# Patient Record
Sex: Female | Born: 1960
Health system: Southern US, Community
[De-identification: ages and names within clinical notes are randomized; demographics above are authoritative.]

## PROBLEM LIST (undated history)

## (undated) DIAGNOSIS — E785 Hyperlipidemia, unspecified: Secondary | ICD-10-CM

## (undated) DIAGNOSIS — B019 Varicella without complication: Secondary | ICD-10-CM

## (undated) DIAGNOSIS — T7840XA Allergy, unspecified, initial encounter: Secondary | ICD-10-CM

## (undated) DIAGNOSIS — N200 Calculus of kidney: Secondary | ICD-10-CM

## (undated) DIAGNOSIS — I10 Essential (primary) hypertension: Secondary | ICD-10-CM

## (undated) DIAGNOSIS — E119 Type 2 diabetes mellitus without complications: Secondary | ICD-10-CM

## (undated) HISTORY — DX: Hyperlipidemia, unspecified: E78.5

## (undated) HISTORY — DX: Type 2 diabetes mellitus without complications: E11.9

## (undated) HISTORY — DX: Varicella without complication: B01.9

## (undated) HISTORY — PX: CATARACT EXTRACTION, BILATERAL: SHX1313

## (undated) HISTORY — DX: Calculus of kidney: N20.0

## (undated) HISTORY — PX: RETINAL DETACHMENT SURGERY: SHX105

## (undated) HISTORY — DX: Allergy, unspecified, initial encounter: T78.40XA

## (undated) HISTORY — DX: Essential (primary) hypertension: I10

---

## 1984-10-03 HISTORY — PX: WRIST SURGERY: SHX841

## 2013-10-29 ENCOUNTER — Ambulatory Visit (INDEPENDENT_AMBULATORY_CARE_PROVIDER_SITE_OTHER): Payer: BC Managed Care – PPO | Admitting: Internal Medicine

## 2013-10-29 ENCOUNTER — Encounter: Payer: Self-pay | Admitting: Internal Medicine

## 2013-10-29 VITALS — BP 158/102 | HR 88 | Temp 98.0°F | Ht 64.5 in | Wt 265.0 lb

## 2013-10-29 DIAGNOSIS — Z13220 Encounter for screening for lipoid disorders: Secondary | ICD-10-CM

## 2013-10-29 DIAGNOSIS — I10 Essential (primary) hypertension: Secondary | ICD-10-CM

## 2013-10-29 LAB — CBC
HEMATOCRIT: 40.9 % (ref 36.0–46.0)
Hemoglobin: 13.8 g/dL (ref 12.0–15.0)
MCHC: 33.7 g/dL (ref 30.0–36.0)
MCV: 86.6 fl (ref 78.0–100.0)
PLATELETS: 284 10*3/uL (ref 150.0–400.0)
RBC: 4.73 Mil/uL (ref 3.87–5.11)
RDW: 13.2 % (ref 11.5–14.6)
WBC: 10.4 10*3/uL (ref 4.5–10.5)

## 2013-10-29 LAB — LIPID PANEL
Cholesterol: 233 mg/dL — ABNORMAL HIGH (ref 0–200)
HDL: 41.9 mg/dL (ref >39.00)
Total CHOL/HDL Ratio: 6
Triglycerides: 282 mg/dL — ABNORMAL HIGH (ref 0.0–149.0)
VLDL: 56.4 mg/dL — ABNORMAL HIGH (ref 0.0–40.0)

## 2013-10-29 LAB — COMPREHENSIVE METABOLIC PANEL
ALBUMIN: 4.1 g/dL (ref 3.5–5.2)
ALT: 12 U/L (ref 0–35)
AST: 17 U/L (ref 0–37)
Alkaline Phosphatase: 84 U/L (ref 39–117)
BUN: 12 mg/dL (ref 6–23)
CO2: 26 meq/L (ref 19–32)
Calcium: 9.3 mg/dL (ref 8.4–10.5)
Chloride: 103 mEq/L (ref 96–112)
Creatinine, Ser: 0.7 mg/dL (ref 0.4–1.2)
GFR: 95.16 mL/min (ref >60.00)
Glucose, Bld: 141 mg/dL — ABNORMAL HIGH (ref 70–99)
Potassium: 4.2 mEq/L (ref 3.5–5.1)
SODIUM: 138 meq/L (ref 135–145)
TOTAL PROTEIN: 7.8 g/dL (ref 6.0–8.3)
Total Bilirubin: 0.6 mg/dL (ref 0.3–1.2)

## 2013-10-29 LAB — LDL CHOLESTEROL, DIRECT: LDL DIRECT: 140.7 mg/dL

## 2013-10-29 LAB — HEMOGLOBIN A1C: Hgb A1c MFr Bld: 7.2 % — ABNORMAL HIGH (ref 4.6–6.5)

## 2013-10-29 LAB — TSH: TSH: 1.64 u[IU]/mL (ref 0.35–5.50)

## 2013-10-29 NOTE — Patient Instructions (Signed)

## 2013-10-29 NOTE — Assessment & Plan Note (Signed)
Due to poor diet and lack of exercise Advised her to try walking for at least 30 minutes daily Incorporate more fresh fruits and veggies into her diet  Will check lipids, TSH and A1C today

## 2013-10-29 NOTE — Assessment & Plan Note (Signed)
Elevated today ? Related to recent 10 lb gain She is not ready to start medication She would like to take 1 month to really focus on diet and weight loss Advised her to check her BP at varying times throughout the day If elevated at the next visit, will started medication therapy If symptomatic (she is not currently) RTC ASAP  Will check CBC and CMET today

## 2013-10-29 NOTE — Progress Notes (Signed)
HPI Pt presents to the clinic today to establish care. She does not have a PCP. She goes to urgent care when she needs something. She does have some concerns about her blood pressure. She has noticed that it has been high over the last few months. Her BP today is 158/102. She also has concerns about her weight. She reports that she has gained 10 lbs over the last month. Her appetite has increased. She is not exercising. She denies s/s of depression or anxiety.  Flu: 07/12/2013 Tetanus: 2007 LMP: post menopausal Pap Smear: 2006 Mammogram: never Colonoscopy: never Eye Doctor: 2013 Dentist: as needed (2013)  Past Medical History  Diagnosis Date  . Chicken pox   . Depression   . Frequent headaches   . Allergy   . Hypertension   . Kidney stones     Current Outpatient Prescriptions  Medication Sig Dispense Refill  . ibuprofen (ADVIL,MOTRIN) 400 MG tablet Take 400 mg by mouth daily as needed.      . Loratadine (CLARITIN) 10 MG CAPS Take 1 capsule by mouth daily.       No current facility-administered medications for this visit.    Allergies  Allergen Reactions  . Morphine And Related Other (See Comments)    Migraine headaches    Family History  Problem Relation Age of Onset  . Arthritis Mother   . Cancer Mother   . Hyperlipidemia Mother   . Heart disease Mother   . Diabetes Mother   . Heart disease Father   . Hyperlipidemia Father   . Arthritis Maternal Grandmother   . Diabetes Maternal Grandmother   . Arthritis Maternal Grandfather   . Hyperlipidemia Maternal Grandfather   . Heart disease Maternal Grandfather   . Stroke Maternal Grandfather   . Cancer Paternal Grandmother   . Diabetes Paternal Grandfather     History   Social History  . Marital Status: Single    Spouse Name: N/A    Number of Children: N/A  . Years of Education: N/A   Occupational History  . Not on file.   Social History Main Topics  . Smoking status: Former Research scientist (life sciences)  . Smokeless tobacco:  Never Used  . Alcohol Use: Yes     Comment: occasional  . Drug Use: No  . Sexual Activity: Not on file   Other Topics Concern  . Not on file   Social History Narrative  . No narrative on file    ROS:  Constitutional: Pt reports weight gain. Denies fever, malaise, fatigue, headache.  Respiratory: Denies difficulty breathing, shortness of breath, cough or sputum production.   Cardiovascular: Denies chest pain, chest tightness, palpitations or swelling in the hands or feet.  Neurological: Denies dizziness, difficulty with memory, difficulty with speech or problems with balance and coordination.   No other specific complaints in a complete review of systems (except as listed in HPI above).  PE:  BP 158/102  Pulse 88  Temp(Src) 98 F (36.7 C) (Oral)  Ht 5' 4.5" (1.638 m)  Wt 265 lb (120.203 kg)  BMI 44.80 kg/m2  SpO2 98% Wt Readings from Last 3 Encounters:  10/29/13 265 lb (120.203 kg)    General: Appears her stated age, obese but well developed, well nourished in NAD. Cardiovascular: Normal rate and rhythm. S1,S2 noted.  No murmur, rubs or gallops noted. No JVD or BLE edema. No carotid bruits noted. Pulmonary/Chest: Normal effort and positive vesicular breath sounds. No respiratory distress. No wheezes, rales or ronchi noted.  Neurological:  Alert and oriented. Cranial nerves II-XII intact. Coordination normal. +DTRs bilaterally.   Assessment and Plan:

## 2013-10-29 NOTE — Progress Notes (Signed)
Pre-visit discussion using our clinic review tool. No additional management support is needed unless otherwise documented below in the visit note.  

## 2013-10-29 NOTE — Progress Notes (Signed)
HPI: Pt presents to the office today to establish care. She has not had insurance and has been going to [facility].  Her only concerns to address was her weight. Pt is aware that is obese, but she is increasingly gaining weight. She has gained 10lbs in one month. She states she eats poorly and does not exercise. She denies depression, stress, or changes in environment.   Flu shot: 2014 Tetanus: 2007 Pap smear: 2006 Mammogram: never Colonoscopy: never Eye exam: 2013 Dentist: 2013 LMP: 2012: menopausal   Past Medical History  Diagnosis Date  . Chicken pox   . Depression   . Frequent headaches   . Allergy   . Hypertension   . Kidney stones     Current Outpatient Prescriptions  Medication Sig Dispense Refill  . ibuprofen (ADVIL,MOTRIN) 400 MG tablet Take 400 mg by mouth daily as needed.      . Loratadine (CLARITIN) 10 MG CAPS Take 1 capsule by mouth daily.       No current facility-administered medications for this visit.    Allergies  Allergen Reactions  . Morphine And Related Other (See Comments)    Migraine headaches    Family History  Problem Relation Age of Onset  . Arthritis Mother   . Cancer Mother   . Hyperlipidemia Mother   . Heart disease Mother   . Diabetes Mother   . Heart disease Father   . Hyperlipidemia Father   . Arthritis Maternal Grandmother   . Diabetes Maternal Grandmother   . Arthritis Maternal Grandfather   . Hyperlipidemia Maternal Grandfather   . Heart disease Maternal Grandfather   . Stroke Maternal Grandfather   . Cancer Paternal Grandmother   . Diabetes Paternal Grandfather     History   Social History  . Marital Status: Single    Spouse Name: N/A    Number of Children: N/A  . Years of Education: N/A   Occupational History  . Not on file.   Social History Main Topics  . Smoking status: Former Research scientist (life sciences)  . Smokeless tobacco: Never Used  . Alcohol Use: Yes     Comment: occasional  . Drug Use: No  . Sexual Activity: Not on  file   Other Topics Concern  . Not on file   Social History Narrative  . No narrative on file    ROS:  Constitutional: Denies fever, malaise, fatigue, headache or  Respiratory: Denies difficulty breathing, shortness of breath, cough or sputum production.   Cardiovascular: Denies chest pain, chest tightness, palpitations or swelling in the hands or feet.   Neurological: Denies dizziness, difficulty with memory, difficulty with speech or problems with balance and coordination.   No other specific complaints in a complete review of systems (except as listed in HPI above).  PE:  BP 158/102  Pulse 88  Temp(Src) 98 F (36.7 C) (Oral)  Ht 5' 4.5" (1.638 m)  Wt 265 lb (120.203 kg)  BMI 44.80 kg/m2  SpO2 98% Wt Readings from Last 3 Encounters:  10/29/13 265 lb (120.203 kg)    General: Appears their stated age, well developed, well nourished in NAD.  Cardiovascular: Normal rate and rhythm. S1,S2 noted.  No murmur, rubs or gallops noted. No JVD or BLE edema. No carotid bruits noted. Pulmonary/Chest: Normal effort and positive vesicular breath sounds. No respiratory distress. No wheezes, rales or ronchi noted.  Neurological: Alert and oriented. Cranial nerves II-XII intact. Coordination normal. +DTRs bilaterally. Psychiatric: Mood and affect normal. Behavior is normal. Judgment and  thought content normal.   Assessment and plan: Health Maintenance: Need to schedule mammogram and colonoscopy as soon as possible Encouraged dental and eye appts. Pap smear next follow up visit Discussed changes in diet at length, portion sizes, healthier choices, and increasing exercising Check cbc, cmet, lipid panel, A1C, and thyroid   Hypertension:  Follow up in one month to recheck Monitor at home Follow up sooner if symptoms begin  Westley Foots, Student-NP

## 2013-10-30 ENCOUNTER — Telehealth: Payer: Self-pay | Admitting: Internal Medicine

## 2013-10-30 NOTE — Telephone Encounter (Signed)
Relevant patient education assigned to patient using Emmi. ° °

## 2013-11-08 ENCOUNTER — Encounter: Payer: Self-pay | Admitting: Internal Medicine

## 2013-11-08 ENCOUNTER — Ambulatory Visit (INDEPENDENT_AMBULATORY_CARE_PROVIDER_SITE_OTHER): Payer: BC Managed Care – PPO | Admitting: Internal Medicine

## 2013-11-08 VITALS — BP 152/102 | HR 84 | Temp 98.1°F | Wt 266.5 lb

## 2013-11-08 DIAGNOSIS — E781 Pure hyperglyceridemia: Secondary | ICD-10-CM | POA: Insufficient documentation

## 2013-11-08 DIAGNOSIS — I10 Essential (primary) hypertension: Secondary | ICD-10-CM

## 2013-11-08 DIAGNOSIS — E119 Type 2 diabetes mellitus without complications: Secondary | ICD-10-CM | POA: Insufficient documentation

## 2013-11-08 MED ORDER — LISINOPRIL-HYDROCHLOROTHIAZIDE 10-12.5 MG PO TABS: 1.0000 | ORAL_TABLET | Freq: Every day | ORAL | Status: DC

## 2013-11-08 MED ORDER — METFORMIN HCL 500 MG PO TABS: 500.0000 mg | ORAL_TABLET | Freq: Two times a day (BID) | ORAL | Status: DC

## 2013-11-08 NOTE — Assessment & Plan Note (Signed)
Foot exam today Advised her to work on diet and exercise Will start Metformin BID daily She declines referral to diabetes education and nutrition (she just went through these classes with her boyfriend)  RTC in 3 months to recheck A1C

## 2013-11-08 NOTE — Assessment & Plan Note (Signed)
Start Fish Oil 1000 mg TID with meals Handout provided for low fat diet  RTC in 3 months to recheck lipid profile

## 2013-11-08 NOTE — Assessment & Plan Note (Signed)
Elevated again today Will start lisinopril-hct Work on diet and exercise  RTC in 2 weeks to recheck blood pressure

## 2013-11-08 NOTE — Progress Notes (Signed)
Subjective:    Patient ID: Tasha Leonard, female    DOB: Jan 12, 1961, 53 y.o.   MRN: [id number]  HPI  Pt presents to the clinic today to follow up labs and elevated blood pressure.  Her labs showed a A1C of 7.2%. She has never been told that she has diabetes or prediabetes. She does have a family history of diabetes. She has had a rapid weight gain over the last 6 months. She does not pay attention to diet and does not exercise. Additionally, her cholesterol and triglycerides are elevated as well. She is hypertensive again today. She was not started on medication at her last visit because she want to try lifestyle changes. Since that time she has gained 1.5 lbs.   Review of Systems      Past Medical History  Diagnosis Date  . Chicken pox   . Depression   . Frequent headaches   . Allergy   . Hypertension   . Kidney stones     Current Outpatient Prescriptions  Medication Sig Dispense Refill  . ibuprofen (ADVIL,MOTRIN) 400 MG tablet Take 400 mg by mouth daily as needed.      . Loratadine (CLARITIN) 10 MG CAPS Take 1 capsule by mouth daily.       No current facility-administered medications for this visit.    Allergies  Allergen Reactions  . Morphine And Related Other (See Comments)    Migraine headaches    Family History  Problem Relation Age of Onset  . Arthritis Mother   . Cancer Mother   . Hyperlipidemia Mother   . Heart disease Mother   . Diabetes Mother   . Heart disease Father   . Hyperlipidemia Father   . Arthritis Maternal Grandmother   . Diabetes Maternal Grandmother   . Arthritis Maternal Grandfather   . Hyperlipidemia Maternal Grandfather   . Heart disease Maternal Grandfather   . Stroke Maternal Grandfather   . Cancer Paternal Grandmother   . Diabetes Paternal Grandfather     History   Social History  . Marital Status: Single    Spouse Name: N/A    Number of Children: N/A  . Years of Education: N/A   Occupational History  . Not on file.     Social History Main Topics  . Smoking status: Former Research scientist (life sciences)  . Smokeless tobacco: Never Used  . Alcohol Use: Yes     Comment: occasional  . Drug Use: No  . Sexual Activity: Not on file   Other Topics Concern  . Not on file   Social History Narrative  . No narrative on file     Constitutional: Denies fever, malaise, fatigue, headache or abrupt weight changes.  Cardiovascular: Denies chest pain, chest tightness, palpitations or swelling in the hands or feet.  GU: Denies polydipsia, polyuria, urgency, frequency, pain with urination, burning sensation, blood in urine, odor or discharge. Neurological: Denies dizziness, difficulty with memory, difficulty with speech or problems with balance and coordination.   No other specific complaints in a complete review of systems (except as listed in HPI above).  Objective:   Physical Exam   BP 152/102  Pulse 84  Temp(Src) 98.1 F (36.7 C) (Oral)  Wt 266 lb 8 oz (120.884 kg)  SpO2 98% Wt Readings from Last 3 Encounters:  11/08/13 266 lb 8 oz (120.884 kg)  10/29/13 265 lb (120.203 kg)    General: Appears her stated age, obese but well developed, well nourished in NAD. Cardiovascular: Normal rate and rhythm.  S1,S2 noted.  No murmur, rubs or gallops noted. No JVD. Trace non pitting edema noted. No carotid bruits noted. Pulmonary/Chest: Normal effort and positive vesicular breath sounds. No respiratory distress. No wheezes, rales or ronchi noted.  Abdomen: Soft and nontender. Normal bowel sounds, no bruits noted. No distention or masses noted. Liver, spleen and kidneys non palpable. Neurological: Alert and oriented. Cranial nerves II-XII intact. Coordination normal. +DTRs bilaterally.   BMET    Component Value Date/Time   NA 138 10/29/2013 1009   K 4.2 10/29/2013 1009   CL 103 10/29/2013 1009   CO2 26 10/29/2013 1009   GLUCOSE 141* 10/29/2013 1009   BUN 12 10/29/2013 1009   CREATININE 0.7 10/29/2013 1009   CALCIUM 9.3 10/29/2013 1009     Lipid Panel     Component Value Date/Time   CHOL 233* 10/29/2013 1009   TRIG 282.0* 10/29/2013 1009   HDL 41.90 10/29/2013 1009   CHOLHDL 6 10/29/2013 1009   VLDL 56.4* 10/29/2013 1009    CBC    Component Value Date/Time   WBC 10.4 10/29/2013 1009   RBC 4.73 10/29/2013 1009   HGB 13.8 10/29/2013 1009   HCT 40.9 10/29/2013 1009   PLT 284.0 10/29/2013 1009   MCV 86.6 10/29/2013 1009   MCHC 33.7 10/29/2013 1009   RDW 13.2 10/29/2013 1009    Hgb A1C Lab Results  Component Value Date   HGBA1C 7.2* 10/29/2013        Assessment & Plan:

## 2013-11-08 NOTE — Patient Instructions (Addendum)
Hypertriglyceridemia  Diet for High blood levels of Triglycerides Most fats in food are triglycerides. Triglycerides in your blood are stored as fat in your body. High levels of triglycerides in your blood may put you at a greater risk for heart disease and stroke.  Normal triglyceride levels are less than 150 mg/dL. Borderline high levels are 150-199 mg/dl. High levels are 200 - 499 mg/dL, and very high triglyceride levels are greater than 500 mg/dL. The decision to treat high triglycerides is generally based on the level. For people with borderline or high triglyceride levels, treatment includes weight loss and exercise. Drugs are recommended for people with very high triglyceride levels. Many people who need treatment for high triglyceride levels have metabolic syndrome. This syndrome is a collection of disorders that often include: insulin resistance, high blood pressure, blood clotting problems, high cholesterol and triglycerides. TESTING PROCEDURE FOR TRIGLYCERIDES  You should not eat 4 hours before getting your triglycerides measured. The normal range of triglycerides is between 10 and 250 milligrams per deciliter (mg/dl). Some people may have extreme levels (1000 or above), but your triglyceride level may be too high if it is above 150 mg/dl, depending on what other risk factors you have for heart disease.  People with high blood triglycerides may also have high blood cholesterol levels. If you have high blood cholesterol as well as high blood triglycerides, your risk for heart disease is probably greater than if you only had high triglycerides. High blood cholesterol is one of the main risk factors for heart disease. CHANGING YOUR DIET  Your weight can affect your blood triglyceride level. If you are more than 20% above your ideal body weight, you may be able to lower your blood triglycerides by losing weight. Eating less and exercising regularly is the best way to combat this. Fat provides more  calories than any other food. The best way to lose weight is to eat less fat. Only 30% of your total calories should come from fat. Less than 7% of your diet should come from saturated fat. A diet low in fat and saturated fat is the same as a diet to decrease blood cholesterol. By eating a diet lower in fat, you may lose weight, lower your blood cholesterol, and lower your blood triglyceride level.  Eating a diet low in fat, especially saturated fat, may also help you lower your blood triglyceride level. Ask your dietitian to help you figure how much fat you can eat based on the number of calories your caregiver has prescribed for you.  Exercise, in addition to helping with weight loss may also help lower triglyceride levels.   Alcohol can increase blood triglycerides. You may need to stop drinking alcoholic beverages.  Too much carbohydrate in your diet may also increase your blood triglycerides. Some complex carbohydrates are necessary in your diet. These may include bread, rice, potatoes, other starchy vegetables and cereals.  Reduce "simple" carbohydrates. These may include pure sugars, candy, honey, and jelly without losing other nutrients. If you have the kind of high blood triglycerides that is affected by the amount of carbohydrates in your diet, you will need to eat less sugar and less high-sugar foods. Your caregiver can help you with this.  Adding 2-4 grams of fish oil (EPA+ DHA) may also help lower triglycerides. Speak with your caregiver before adding any supplements to your regimen. Following the Diet  Maintain your ideal weight. Your caregivers can help you with a diet. Generally, eating less food and getting more   exercise will help you lose weight. Joining a weight control group may also help. Ask your caregivers for a good weight control group in your area.  Eat low-fat foods instead of high-fat foods. This can help you lose weight too.  These foods are lower in fat. Eat MORE of these:    Dried beans, peas, and lentils.  Egg whites.  Low-fat cottage cheese.  Fish.  Lean cuts of meat, such as round, sirloin, rump, and flank (cut extra fat off meat you fix).  Whole grain breads, cereals and pasta.  Skim and nonfat dry milk.  Low-fat yogurt.  Poultry without the skin.  Cheese made with skim or part-skim milk, such as mozzarella, parmesan, farmers', ricotta, or pot cheese. These are higher fat foods. Eat LESS of these:   Whole milk and foods made from whole milk, such as American, blue, cheddar, monterey jack, and swiss cheese  High-fat meats, such as luncheon meats, sausages, knockwurst, bratwurst, hot dogs, ribs, corned beef, ground pork, and regular ground beef.  Fried foods. Limit saturated fats in your diet. Substituting unsaturated fat for saturated fat may decrease your blood triglyceride level. You will need to read package labels to know which products contain saturated fats.  These foods are high in saturated fat. Eat LESS of these:   Fried pork skins.  Whole milk.  Skin and fat from poultry.  Palm oil.  Butter.  Shortening.  Cream cheese.  Bacon.  Margarines and baked goods made from listed oils.  Vegetable shortenings.  Chitterlings.  Fat from meats.  Coconut oil.  Palm kernel oil.  Lard.  Cream.  Sour cream.  Fatback.  Coffee whiteners and non-dairy creamers made with these oils.  Cheese made from whole milk. Use unsaturated fats (both polyunsaturated and monounsaturated) moderately. Remember, even though unsaturated fats are better than saturated fats; you still want a diet low in total fat.  These foods are high in unsaturated fat:   Canola oil.  Sunflower oil.  Mayonnaise.  Almonds.  Peanuts.  Pine nuts.  Margarines made with these oils.  Safflower oil.  Olive oil.  Avocados.  Cashews.  Peanut butter.  Sunflower seeds.  Soybean oil.  Peanut  oil.  Olives.  Pecans.  Walnuts.  Pumpkin seeds. Avoid sugar and other high-sugar foods. This will decrease carbohydrates without decreasing other nutrients. Sugar in your food goes rapidly to your blood. When there is excess sugar in your blood, your liver may use it to make more triglycerides. Sugar also contains calories without other important nutrients.  Eat LESS of these:   Sugar, brown sugar, powdered sugar, jam, jelly, preserves, honey, syrup, molasses, pies, candy, cakes, cookies, frosting, pastries, colas, soft drinks, punches, fruit drinks, and regular gelatin.  Avoid alcohol. Alcohol, even more than sugar, may increase blood triglycerides. In addition, alcohol is high in calories and low in nutrients. Ask for sparkling water, or a diet soft drink instead of an alcoholic beverage. Suggestions for planning and preparing meals   Bake, broil, grill or roast meats instead of frying.  Remove fat from meats and skin from poultry before cooking.  Add spices, herbs, lemon juice or vinegar to vegetables instead of salt, rich sauces or gravies.  Use a non-stick skillet without fat or use no-stick sprays.  Cool and refrigerate stews and broth. Then remove the hardened fat floating on the surface before serving.  Refrigerate meat drippings and skim off fat to make low-fat gravies.  Serve more fish.  Use less butter,   margarine and other high-fat spreads on bread or vegetables.  Use skim or reconstituted non-fat dry milk for cooking.  Cook with low-fat cheeses.  Substitute low-fat yogurt or cottage cheese for all or part of the sour cream in recipes for sauces, dips or congealed salads.  Use half yogurt/half mayonnaise in salad recipes.  Substitute evaporated skim milk for cream. Evaporated skim milk or reconstituted non-fat dry milk can be whipped and substituted for whipped cream in certain recipes.  Choose fresh fruits for dessert instead of high-fat foods such as pies or  cakes. Fruits are naturally low in fat. When Dining Out   Order low-fat appetizers such as fruit or vegetable juice, pasta with vegetables or tomato sauce.  Select clear, rather than cream soups.  Ask that dressings and gravies be served on the side. Then use less of them.  Order foods that are baked, broiled, poached, steamed, stir-fried, or roasted.  Ask for margarine instead of butter, and use only a small amount.  Drink sparkling water, unsweetened tea or coffee, or diet soft drinks instead of alcohol or other sweet beverages. QUESTIONS AND ANSWERS ABOUT OTHER FATS IN THE BLOOD: SATURATED FAT, TRANS FAT, AND CHOLESTEROL What is trans fat? Trans fat is a type of fat that is formed when vegetable oil is hardened through a process called hydrogenation. This process helps makes foods more solid, gives them shape, and prolongs their shelf life. Trans fats are also called hydrogenated or partially hydrogenated oils.  What do saturated fat, trans fat, and cholesterol in foods have to do with heart disease? Saturated fat, trans fat, and cholesterol in the diet all raise the level of LDL "bad" cholesterol in the blood. The higher the LDL cholesterol, the greater the risk for coronary heart disease (CHD). Saturated fat and trans fat raise LDL similarly.  What foods contain saturated fat, trans fat, and cholesterol? High amounts of saturated fat are found in animal products, such as fatty cuts of meat, chicken skin, and full-fat dairy products like butter, whole milk, cream, and cheese, and in tropical vegetable oils such as palm, palm kernel, and coconut oil. Trans fat is found in some of the same foods as saturated fat, such as vegetable shortening, some margarines (especially hard or stick margarine), crackers, cookies, baked goods, fried foods, salad dressings, and other processed foods made with partially hydrogenated vegetable oils. Small amounts of trans fat also occur naturally in some animal  products, such as milk products, beef, and lamb. Foods high in cholesterol include liver, other organ meats, egg yolks, shrimp, and full-fat dairy products. How can I use the new food label to make heart-healthy food choices? Check the Nutrition Facts panel of the food label. Choose foods lower in saturated fat, trans fat, and cholesterol. For saturated fat and cholesterol, you can also use the Percent Daily Value (%DV): 5% DV or less is low, and 20% DV or more is high. (There is no %DV for trans fat.) Use the Nutrition Facts panel to choose foods low in saturated fat and cholesterol, and if the trans fat is not listed, read the ingredients and limit products that list shortening or hydrogenated or partially hydrogenated vegetable oil, which tend to be high in trans fat. POINTS TO REMEMBER:   Discuss your risk for heart disease with your caregivers, and take steps to reduce risk factors.  Change your diet. Choose foods that are low in saturated fat, trans fat, and cholesterol.  Add exercise to your daily routine if   it is not already being done. Participate in physical activity of moderate intensity, like brisk walking, for at least 30 minutes on most, and preferably all days of the week. No time? Break the 30 minutes into three, 10-minute segments during the day.  Stop smoking. If you do smoke, contact your caregiver to discuss ways in which they can help you quit.  Do not use street drugs.  Maintain a normal weight.  Maintain a healthy blood pressure.  Keep up with your blood work for checking the fats in your blood as directed by your caregiver. Document Released: 07/07/2004 Document Revised: 03/20/2012 Document Reviewed: 02/02/2009 [facility] Patient Information 2014 [place]. Type 2 Diabetes Mellitus, Adult Type 2 diabetes mellitus, often simply referred to as type 2 diabetes, is a long-lasting (chronic) disease. In type 2 diabetes, the pancreas does not make enough insulin (a  hormone), the cells are less responsive to the insulin that is made (insulin resistance), or both. Normally, insulin moves sugars from food into the tissue cells. The tissue cells use the sugars for energy. The lack of insulin or the lack of normal response to insulin causes excess sugars to build up in the blood instead of going into the tissue cells. As a result, high blood sugar (hyperglycemia) develops. The effect of high sugar (glucose) levels can cause many complications. Type 2 diabetes was also previously called adult-onset diabetes but it can occur at any age.  RISK FACTORS  A person is predisposed to developing type 2 diabetes if someone in the family has the disease and also has one or more of the following primary risk factors:  Overweight.  An inactive lifestyle.  A history of consistently eating high-calorie foods. Maintaining a normal weight and regular physical activity can reduce the chance of developing type 2 diabetes. SYMPTOMS  A person with type 2 diabetes may not show symptoms initially. The symptoms of type 2 diabetes appear slowly. The symptoms include:  Increased thirst (polydipsia).  Increased urination (polyuria).  Increased urination during the night (nocturia).  Weight loss. This weight loss may be rapid.  Frequent, recurring infections.  Tiredness (fatigue).  Weakness.  Vision changes, such as blurred vision.  Fruity smell to your breath.  Abdominal pain.  Nausea or vomiting.  Cuts or bruises which are slow to heal.  Tingling or numbness in the hands or feet. DIAGNOSIS Type 2 diabetes is frequently not diagnosed until complications of diabetes are present. Type 2 diabetes is diagnosed when symptoms or complications are present and when blood glucose levels are increased. Your blood glucose level may be checked by one or more of the following blood tests:  A fasting blood glucose test. You will not be allowed to eat for at least 8 hours before  a blood sample is taken.  A random blood glucose test. Your blood glucose is checked at any time of the day regardless of when you ate.  A hemoglobin A1c blood glucose test. A hemoglobin A1c test provides information about blood glucose control over the previous 3 months.  An oral glucose tolerance test (OGTT). Your blood glucose is measured after you have not eaten (fasted) for 2 hours and then after you drink a glucose-containing beverage. TREATMENT   You may need to take insulin or diabetes medicine daily to keep blood glucose levels in the desired range.  You will need to match insulin dosing with exercise and healthy food choices. The treatment goal is to maintain the before meal blood sugar (preprandial glucose)  level at 70 130 mg/dL. HOME CARE INSTRUCTIONS   Have your hemoglobin A1c level checked twice a year.  Perform daily blood glucose monitoring as directed by your caregiver.  Monitor urine ketones when you are ill and as directed by your caregiver.  Take your diabetes medicine or insulin as directed by your caregiver to maintain your blood glucose levels in the desired range.  Never run out of diabetes medicine or insulin. It is needed every day.  Adjust insulin based on your intake of carbohydrates. Carbohydrates can raise blood glucose levels but need to be included in your diet. Carbohydrates provide vitamins, minerals, and fiber which are an essential part of a healthy diet. Carbohydrates are found in fruits, vegetables, whole grains, dairy products, legumes, and foods containing added sugars.    Eat healthy foods. Alternate 3 meals with 3 snacks.  Lose weight if overweight.  Carry a medical alert card or wear your medical alert jewelry.  Carry a 15 gram carbohydrate snack with you at all times to treat low blood glucose (hypoglycemia). Some examples of 15 gram carbohydrate snacks include:  Glucose tablets, 3 or 4   Glucose gel, 15 gram tube  Raisins, 2  tablespoons (24 grams)  Jelly beans, 6  Animal crackers, 8  Regular pop, 4 ounces (120 mL)  Gummy treats, 9  Recognize hypoglycemia. Hypoglycemia occurs with blood glucose levels of 70 mg/dL and below. The risk for hypoglycemia increases when fasting or skipping meals, during or after intense exercise, and during sleep. Hypoglycemia symptoms can include:  Tremors or shakes.  Decreased ability to concentrate.  Sweating.  Increased heart rate.  Headache.  Dry mouth.  Hunger.  Irritability.  Anxiety.  Restless sleep.  Altered speech or coordination.  Confusion.  Treat hypoglycemia promptly. If you are alert and able to safely swallow, follow the 15:15 rule:  Take 15 20 grams of rapid-acting glucose or carbohydrate. Rapid-acting options include glucose gel, glucose tablets, or 4 ounces (120 mL) of fruit juice, regular soda, or low fat milk.  Check your blood glucose level 15 minutes after taking the glucose.  Take 15 20 grams more of glucose if the repeat blood glucose level is still 70 mg/dL or below.  Eat a meal or snack within 1 hour once blood glucose levels return to normal.    Be alert to polyuria and polydipsia which are early signs of hyperglycemia. An early awareness of hyperglycemia allows for prompt treatment. Treat hyperglycemia as directed by your caregiver.  Engage in at least 150 minutes of moderate-intensity physical activity a week, spread over at least 3 days of the week or as directed by your caregiver. In addition, you should engage in resistance exercise at least 2 times a week or as directed by your caregiver.  Adjust your medicine and food intake as needed if you start a new exercise or sport.  Follow your sick day plan at any time you are unable to eat or drink as usual.  Avoid tobacco use.  Limit alcohol intake to no more than 1 drink per day for nonpregnant women and 2 drinks per day for men. You should drink alcohol only when you are  also eating food. Talk with your caregiver whether alcohol is safe for you. Tell your caregiver if you drink alcohol several times a week.  Follow up with your caregiver regularly.  Schedule an eye exam soon after the diagnosis of type 2 diabetes and then annually.  Perform daily skin and foot care. Examine  your skin and feet daily for cuts, bruises, redness, nail problems, bleeding, blisters, or sores. A foot exam by a caregiver should be done annually.  Brush your teeth and gums at least twice a day and floss at least once a day. Follow up with your dentist regularly.  Share your diabetes management plan with your workplace or school.  Stay up-to-date with immunizations.  Learn to manage stress.  Obtain ongoing diabetes education and support as needed.  Participate in, or seek rehabilitation as needed to maintain or improve independence and quality of life. Request a physical or occupational therapy referral if you are having foot or hand numbness or difficulties with grooming, dressing, eating, or physical activity. SEEK MEDICAL CARE IF:   You are unable to eat food or drink fluids for more than 6 hours.  You have nausea and vomiting for more than 6 hours.  Your blood glucose level is over 240 mg/dL.  There is a change in mental status.  You develop an additional serious illness.  You have diarrhea for more than 6 hours.  You have been sick or have had a fever for a couple of days and are not getting better.  You have pain during any physical activity.  SEEK IMMEDIATE MEDICAL CARE IF:  You have difficulty breathing.  You have moderate to large ketone levels. MAKE SURE YOU:  Understand these instructions.  Will watch your condition.  Will get help right away if you are not doing well or get worse. Document Released: 09/19/2005 Document Revised: 06/13/2012 Document Reviewed: 04/17/2012 [facility] Patient Information 2014 [place].

## 2013-11-08 NOTE — Assessment & Plan Note (Signed)
Advised her to start a low fat, low carb diet- handout provided Try to work on a 5 lb weight loss before your next visit (2 weeks)

## 2013-11-08 NOTE — Progress Notes (Signed)
Pre-visit discussion using our clinic review tool. No additional management support is needed unless otherwise documented below in the visit note.  

## 2013-11-13 ENCOUNTER — Telehealth: Payer: Self-pay

## 2013-11-13 NOTE — Telephone Encounter (Signed)
Relevant patient education assigned to patient using Emmi. ° °

## 2013-11-29 ENCOUNTER — Ambulatory Visit: Payer: BC Managed Care – PPO | Admitting: Internal Medicine

## 2013-12-02 ENCOUNTER — Ambulatory Visit (INDEPENDENT_AMBULATORY_CARE_PROVIDER_SITE_OTHER): Payer: BC Managed Care – PPO | Admitting: Internal Medicine

## 2013-12-02 ENCOUNTER — Encounter: Payer: Self-pay | Admitting: Internal Medicine

## 2013-12-02 VITALS — BP 134/86 | HR 90 | Temp 98.3°F | Wt 266.0 lb

## 2013-12-02 DIAGNOSIS — R110 Nausea: Secondary | ICD-10-CM

## 2013-12-02 DIAGNOSIS — T887XXA Unspecified adverse effect of drug or medicament, initial encounter: Secondary | ICD-10-CM

## 2013-12-02 DIAGNOSIS — I10 Essential (primary) hypertension: Secondary | ICD-10-CM

## 2013-12-02 DIAGNOSIS — R197 Diarrhea, unspecified: Secondary | ICD-10-CM

## 2013-12-02 NOTE — Progress Notes (Signed)
Pre visit review using our clinic review tool, if applicable. No additional management support is needed unless otherwise documented below in the visit note. 

## 2013-12-02 NOTE — Progress Notes (Signed)
Subjective:    Patient ID: Tasha Leonard, female    DOB: 11-29-1960, 53 y.o.   MRN: [id number]  HPI  Pt presents to the clinic today for 1 month f/u of HTN. She failed lifestyle changes. She was started on lisinopril-hctz 10-12.5 mg. Her BP today is 134/86. She denies headache, blurred vision or dizziness.  She is having some issues with her diet since she was started on Metformin. Certain foods are causing her a lot of nausea, like orange juice and dairy. She is also experiencing diarrhea with certain foods.  Review of Systems      Past Medical History  Diagnosis Date  . Chicken pox   . Depression   . Frequent headaches   . Allergy   . Hypertension   . Kidney stones     Current Outpatient Prescriptions  Medication Sig Dispense Refill  . ibuprofen (ADVIL,MOTRIN) 400 MG tablet Take 400 mg by mouth daily as needed.      Marland Kitchen lisinopril-hydrochlorothiazide (PRINZIDE,ZESTORETIC) 10-12.5 MG per tablet Take 1 tablet by mouth daily.  30 tablet  0  . Loratadine (CLARITIN) 10 MG CAPS Take 1 capsule by mouth daily.      . metFORMIN (GLUCOPHAGE) 500 MG tablet Take 1 tablet (500 mg total) by mouth 2 (two) times daily with a meal.  60 tablet  2   No current facility-administered medications for this visit.    Allergies  Allergen Reactions  . Morphine And Related Other (See Comments)    Migraine headaches    Family History  Problem Relation Age of Onset  . Arthritis Mother   . Cancer Mother   . Hyperlipidemia Mother   . Heart disease Mother   . Diabetes Mother   . Heart disease Father   . Hyperlipidemia Father   . Arthritis Maternal Grandmother   . Diabetes Maternal Grandmother   . Arthritis Maternal Grandfather   . Hyperlipidemia Maternal Grandfather   . Heart disease Maternal Grandfather   . Stroke Maternal Grandfather   . Cancer Paternal Grandmother   . Diabetes Paternal Grandfather     History   Social History  . Marital Status: Single    Spouse Name: N/A   Number of Children: N/A  . Years of Education: N/A   Occupational History  . Not on file.   Social History Main Topics  . Smoking status: Former Research scientist (life sciences)  . Smokeless tobacco: Never Used  . Alcohol Use: Yes     Comment: occasional  . Drug Use: No  . Sexual Activity: Not on file   Other Topics Concern  . Not on file   Social History Narrative  . No narrative on file     Constitutional: Denies fever, malaise, fatigue, headache or abrupt weight changes.  Cardiovascular: Denies chest pain, chest tightness, palpitations or swelling in the hands or feet.  GI: Pt reports nausea and diarrhea. Denies abdominal pain, bloating, constipation or blood in her stool. Neurological: Denies dizziness, difficulty with memory, difficulty with speech or problems with balance and coordination.   No other specific complaints in a complete review of systems (except as listed in HPI above).  Objective:   Physical Exam  BP 134/86  Pulse 90  Temp(Src) 98.3 F (36.8 C) (Oral)  Wt 266 lb (120.657 kg)  SpO2 98% Wt Readings from Last 3 Encounters:  12/02/13 266 lb (120.657 kg)  11/08/13 266 lb 8 oz (120.884 kg)  10/29/13 265 lb (120.203 kg)    General: Appears her stated age,  obese but well developed, well nourished in NAD. Cardiovascular: Normal rate and rhythm. S1,S2 noted.  No murmur, rubs or gallops noted. No JVD or BLE edema. No carotid bruits noted. Pulmonary/Chest: Normal effort and positive vesicular breath sounds. No respiratory distress. No wheezes, rales or ronchi noted.  Abdomen: Soft and nontender. Normal bowel sounds, no bruits noted. No distention or masses noted. Liver, spleen and kidneys non palpable.  BMET    Component Value Date/Time   NA 138 10/29/2013 1009   K 4.2 10/29/2013 1009   CL 103 10/29/2013 1009   CO2 26 10/29/2013 1009   GLUCOSE 141* 10/29/2013 1009   BUN 12 10/29/2013 1009   CREATININE 0.7 10/29/2013 1009   CALCIUM 9.3 10/29/2013 1009    Lipid Panel       Component Value Date/Time   CHOL 233* 10/29/2013 1009   TRIG 282.0* 10/29/2013 1009   HDL 41.90 10/29/2013 1009   CHOLHDL 6 10/29/2013 1009   VLDL 56.4* 10/29/2013 1009    CBC    Component Value Date/Time   WBC 10.4 10/29/2013 1009   RBC 4.73 10/29/2013 1009   HGB 13.8 10/29/2013 1009   HCT 40.9 10/29/2013 1009   PLT 284.0 10/29/2013 1009   MCV 86.6 10/29/2013 1009   MCHC 33.7 10/29/2013 1009   RDW 13.2 10/29/2013 1009    Hgb A1C Lab Results  Component Value Date   HGBA1C 7.2* 10/29/2013         Assessment & Plan:   Diarrhea and nausea, secondary to metformin:  Continue for now Will refer to diabetes education and nutrition If continues to have issues at next visit, will switch to glipizide  RTC in 2 months to recheck labs

## 2013-12-02 NOTE — Assessment & Plan Note (Signed)
Well controlled Continue current therapy at this time

## 2013-12-02 NOTE — Patient Instructions (Addendum)

## 2013-12-08 ENCOUNTER — Other Ambulatory Visit: Payer: Self-pay | Admitting: Internal Medicine

## 2013-12-08 DIAGNOSIS — I10 Essential (primary) hypertension: Secondary | ICD-10-CM

## 2013-12-09 MED ORDER — LISINOPRIL-HYDROCHLOROTHIAZIDE 10-12.5 MG PO TABS: 1.0000 | ORAL_TABLET | Freq: Every day | ORAL | Status: DC

## 2014-01-09 ENCOUNTER — Other Ambulatory Visit: Payer: Self-pay

## 2014-02-01 ENCOUNTER — Other Ambulatory Visit: Payer: Self-pay | Admitting: Internal Medicine

## 2014-02-03 ENCOUNTER — Encounter: Payer: Self-pay | Admitting: Internal Medicine

## 2014-02-03 ENCOUNTER — Ambulatory Visit (INDEPENDENT_AMBULATORY_CARE_PROVIDER_SITE_OTHER): Payer: BC Managed Care – PPO | Admitting: Internal Medicine

## 2014-02-03 VITALS — BP 128/84 | HR 79 | Temp 98.2°F | Wt 259.0 lb

## 2014-02-03 DIAGNOSIS — E119 Type 2 diabetes mellitus without complications: Secondary | ICD-10-CM

## 2014-02-03 DIAGNOSIS — E781 Pure hyperglyceridemia: Secondary | ICD-10-CM

## 2014-02-03 DIAGNOSIS — R0602 Shortness of breath: Secondary | ICD-10-CM

## 2014-02-03 DIAGNOSIS — R079 Chest pain, unspecified: Secondary | ICD-10-CM

## 2014-02-03 DIAGNOSIS — R9431 Abnormal electrocardiogram [ECG] [EKG]: Secondary | ICD-10-CM

## 2014-02-03 DIAGNOSIS — I10 Essential (primary) hypertension: Secondary | ICD-10-CM

## 2014-02-03 LAB — LIPID PANEL
CHOL/HDL RATIO: 5
CHOLESTEROL: 202 mg/dL — AB (ref 0–200)
HDL: 40.7 mg/dL (ref >39.00)
LDL CALC: 115 mg/dL — AB (ref 0–99)
Triglycerides: 231 mg/dL — ABNORMAL HIGH (ref 0.0–149.0)
VLDL: 46.2 mg/dL — ABNORMAL HIGH (ref 0.0–40.0)

## 2014-02-03 LAB — HEMOGLOBIN A1C: HEMOGLOBIN A1C: 6.4 % (ref 4.6–6.5)

## 2014-02-03 LAB — MICROALBUMIN / CREATININE URINE RATIO
Creatinine,U: 125.3 mg/dL
Microalb Creat Ratio: 1 mg/g (ref 0.0–30.0)
Microalb, Ur: 1.2 mg/dL (ref 0.0–1.9)

## 2014-02-03 MED ORDER — LISINOPRIL-HYDROCHLOROTHIAZIDE 10-12.5 MG PO TABS: 1.0000 | ORAL_TABLET | Freq: Every day | ORAL | Status: DC

## 2014-02-03 NOTE — Progress Notes (Signed)
Pre visit review using our clinic review tool, if applicable. No additional management support is needed unless otherwise documented below in the visit note. 

## 2014-02-03 NOTE — Telephone Encounter (Signed)
Pt had labs done today and wanted to verify how many she had left--just in case the medication may need to be changed per labs and Lacey Jensen she could wait until tomorrow or Wed--left detailed msg for pt to return call

## 2014-02-03 NOTE — Telephone Encounter (Signed)
Pt returned call and states she has enough to last until Friday--so if no changes to medication will send in refill

## 2014-02-03 NOTE — Assessment & Plan Note (Signed)
She has lost 7 lbs Encouraged her to continue to work on diet and exercise

## 2014-02-03 NOTE — Patient Instructions (Signed)
Diabetes and Exercise Exercising regularly is important. It is not just about losing weight. It has many health benefits, such as:  Improving your overall fitness, flexibility, and endurance.  Increasing your bone density.  Helping with weight control.  Decreasing your body fat.  Increasing your muscle strength.  Reducing stress and tension.  Improving your overall health. People with diabetes who exercise gain additional benefits because exercise:  Reduces appetite.  Improves the body's use of blood sugar (glucose).  Helps lower or control blood glucose.  Decreases blood pressure.  Helps control blood lipids (such as cholesterol and triglycerides).  Improves the body's use of the hormone insulin by:  Increasing the body's insulin sensitivity.  Reducing the body's insulin needs.  Decreases the risk for heart disease because exercising:  Lowers cholesterol and triglycerides levels.  Increases the levels of good cholesterol (such as high-density lipoproteins [HDL]) in the body.  Lowers blood glucose levels. YOUR ACTIVITY PLAN  Choose an activity that you enjoy and set realistic goals. Your health care provider or diabetes educator can help you make an activity plan that works for you. You can break activities into 2 or 3 sessions throughout the day. Doing so is as good as one long session. Exercise ideas include:  Taking the dog for a walk.  Taking the stairs instead of the elevator.  Dancing to your favorite song.  Doing your favorite exercise with a friend. RECOMMENDATIONS FOR EXERCISING WITH TYPE 1 OR TYPE 2 DIABETES   Check your blood glucose before exercising. If blood glucose levels are greater than 240 mg/dL, check for urine ketones. Do not exercise if ketones are present.  Avoid injecting insulin into areas of the body that are going to be exercised. For example, avoid injecting insulin into:  The arms when playing tennis.  The legs when  jogging.  Keep a record of:  Food intake before and after you exercise.  Expected peak times of insulin action.  Blood glucose levels before and after you exercise.  The type and amount of exercise you have done.  Review your records with your health care provider. Your health care provider will help you to develop guidelines for adjusting food intake and insulin amounts before and after exercising.  If you take insulin or oral hypoglycemic agents, watch for signs and symptoms of hypoglycemia. They include:  Dizziness.  Shaking.  Sweating.  Chills.  Confusion.  Drink plenty of water while you exercise to prevent dehydration or heat stroke. Body water is lost during exercise and must be replaced.  Talk to your health care provider before starting an exercise program to make sure it is safe for you. Remember, almost any type of activity is better than none. Document Released: 12/10/2003 Document Revised: 05/22/2013 Document Reviewed: 02/26/2013 [facility] Patient Information 2014 [place]. Diabetes, Eating Away From Home Sometimes, you might eat in a restaurant or have meals that are prepared by someone else. You can enjoy eating out. However, the portions in restaurants may be much larger than needed. Listed below are some ideas to help you choose foods that will keep your blood glucose (sugar) in better control.  TIPS FOR EATING OUT  Know your meal plan and how many carbohydrate servings you should have at each meal. You may wish to carry a copy of your meal plan in your purse or wallet. Learn the foods included in each food group.  Make a list of restaurants near you that offer healthy choices. Take a copy of the  carry-out menus to see what they offer. Then, you can plan what you will order ahead of time.  Become familiar with serving sizes by practicing them at home using measuring cups and spoons. Once you learn to recognize portion sizes, you will be able to correctly  estimate the amount of total carbohydrate you are allowed to eat at the restaurant. Ask for a takeout box if the portion is more than you should have. When your food comes, leave the amount you should have on the plate, and put the rest in the takeout box before you start eating.  Plan ahead if your mealtime will be different from usual. Check with your caregiver to find out how to time meals and medicine if you are taking insulin.  Avoid high-fat foods, such as fried foods, cream sauces, high-fat salad dressings, or any added butter or margarine.  Do not be afraid to ask questions. Ask your server about the portion size, cooking methods, ingredients and if items can be substituted. Restaurants do not list all available items on the menu. You can ask for your main entree to be prepared using skim milk, oil instead of butter or margarine, and without gravy or sauces. Ask your waiter or waitress to serve salad dressings, gravy, sauces, margarine, and sour cream on the side. You can then add the amount your meal plan suggests.  Add more vegetables whenever possible.  Avoid items that are labeled "jumbo," "giant," "deluxe," or "supersized."  You may want to split an entre with someone and order an extra side salad.  Watch for hidden calories in foods like croutons, bacon, or cheese.  Ask your server to take away the bread basket or chips from your table.  Order a dinner salad as an appetizer. You can eat most foods served in a restaurant. Some foods are better choices than others. Breads and Starches  Recommended: All kinds of bread (wheat, rye, white, oatmeal, [place], [place], raisin), hard or soft dinner rolls, frankfurter or hamburger buns, small bagels, small corn or whole-wheat flour tortillas.  Avoid: Frosted or glazed breads, butter rolls, egg or cheese breads, croissants, sweet rolls, pastries, coffee cake, glazed or frosted doughnuts, muffins. Crackers  Recommended: Animal crackers,  graham, rye, saltine, oyster, and matzoth crackers. Bread sticks, melba toast, rusks, pretzels, popcorn (without fat), zwieback toast.  Avoid: High-fat snack crackers or chips. Buttered popcorn. Cereals  Recommended: Hot and cold cereals. Whole grains such as oatmeal or shredded wheat are good choices.  Avoid: Sugar-coated or granola type cereals. Potatoes/Pasta/Rice/Beans  Recommended: Order baked, boiled, or mashed potatoes, rice or noodles without added fat, whole beans. Order gravies, butter, margarine, or sauces on the side so you can control the amount you add.  Avoid: Hash browns or fried potatoes. Potatoes, pasta, or rice prepared with cream or cheese sauce. Potato or pasta salads prepared with large amounts of dressing. Fried beans or fried rice. Vegetables  Recommended: Order steamed, baked, boiled, or stewed vegetables without sauces or extra fat. Ask that sauce be served on the side. If vegetables are not listed on the menu, ask what is available.  Avoid: Vegetables prepared with cream, butter, or cheese sauce. Fried vegetables. Salad Bars  Recommended: Many of the vegetables at a salad bar are considered "free." Use lemon juice, vinegar, or low-calorie salad dressing (fewer than 20 calories per serving) as "free" dressings for your salad. Look for salad bar ingredients that have no added fat or sugar such as tomatoes, lettuce, cucumbers, broccoli, carrots,  onions, and mushrooms.  Avoid: Prepared salads with large amounts of dressing, such as coleslaw, caesar salad, macaroni salad, bean salad, or carrot salad. Fruit  Recommended: Eat fresh fruit or fresh fruit salad without added dressing. A salad bar often offers fresh fruit choices, but canned fruit at a restaurant is usually packed in sugar or syrup.  Avoid: Sweetened canned or frozen fruits, plain or sweetened fruit juice. Fruit salads with dressing, sour cream, or sugar added to them. Meat and Meat  Substitutes  Recommended: Order broiled, baked, roasted, or grilled meat, poultry, or fish. Trim off all visible fat. Do not eat the skin of poultry. The size stated on the menu is the raw weight. Meat shrinks by  in cooking (for example, 4 oz raw equals 3 oz cooked meat).  Avoid: Deep-fat fried meat, poultry, or fish. Breaded meats. Eggs  Recommended: Order soft, hard-cooked, poached, or scrambled eggs. Omelets may be okay, depending on what ingredients are added. Egg substitutes are also a good choice.  Avoid: Fried eggs, eggs prepared with cream or cheese sauce. Milk  Recommended: Order low-fat or fat-free milk according to your meal plan. Plain, nonfat yogurt or flavored yogurt with no sugar added may be used as a substitute for milk. Soy milk may also be used.  Avoid: Milk shakes or sweetened milk beverages. Soups and Combination Foods  Recommended: Clear broth or consomm are "free" foods and may be used as an appetizer. Broth-based soups with fat removed count as a starch serving and are preferred over cream soups. Soups made with beans or split peas may be eaten but count as a starch.  Avoid: Fatty soups, soup made with cream, cheese soup. Combination foods prepared with excessive amounts of fat or with cream or cheese sauces. Desserts and Sweets  Recommended: Ask for fresh fruit. Sponge or angel food cake without icing, ice milk, no sugar added ice cream, sherbet, or frozen yogurt may fit into your meal plan occasionally.  Avoid: Pastries, puddings, pies, cakes with icing, custard, gelatin desserts. Fats and Oils  Recommended: Choose healthy fats such as olive oil, canola oil, or tub margarine, reduced fat or fat-free sour cream, cream cheese, avocado, or nuts.  Avoid: Any fats in excess of your allowed portion. Deep-fried foods or any food with a large amount of fat. Note: Ask for all fats to be served on the side, and limit your portion sizes according to your meal  plan. Document Released: 09/19/2005 Document Revised: 12/12/2011 Document Reviewed: 04/09/2009 [facility] Patient Information 2014 [place], [place].

## 2014-02-03 NOTE — Addendum Note (Signed)
Addended by: Jearld Fenton on: 02/03/2014 09:30 AM   Modules accepted: Orders

## 2014-02-03 NOTE — Assessment & Plan Note (Addendum)
Currently on metformin Will recheck A1C and microalbumin today Continue to work on diet and exercise

## 2014-02-03 NOTE — Assessment & Plan Note (Addendum)
Well controlled on lisinopril-HCT Will continue for now She is having some sun sensitivity Encouraged her to use sunscreen

## 2014-02-03 NOTE — Progress Notes (Signed)
Subjective:    Patient ID: Tasha Leonard, female    DOB: 12-02-1960, 53 y.o.   MRN: [id number]  HPI  Pt presents to the clinic today for follow up of diabetes. She was diagnosed 11/2012. She was having some diarrhea while on the metformin. This has improved some. She is able to deal with it. She does have a burning sensation in her hands, but has not noticed a rash. She is also here to followup HTN. Her blood pressure is well controlled on Lisinopril-HCT. She does feel like her skin is more sensitive to the sun than before. She does have some concerns today about shortness of breath. This seems to be worse with exercise or activity and relieved by rest. She denies chest pain, but has had some chest discomfort. She has never had this before. She does have a history of a high triglycerides. She does have a positive family history of heart disease. She does not smoke.   Foot exam: 12/02/13 Flu: 07/2013 Pneumovax: never  Review of Systems      Past Medical History  Diagnosis Date  . Chicken pox   . Depression   . Frequent headaches   . Allergy   . Hypertension   . Kidney stones     Current Outpatient Prescriptions  Medication Sig Dispense Refill  . ibuprofen (ADVIL,MOTRIN) 400 MG tablet Take 400 mg by mouth daily as needed.      Marland Kitchen lisinopril-hydrochlorothiazide (PRINZIDE,ZESTORETIC) 10-12.5 MG per tablet Take 1 tablet by mouth daily.  30 tablet  2  . Loratadine (CLARITIN) 10 MG CAPS Take 1 capsule by mouth daily.      . metFORMIN (GLUCOPHAGE) 500 MG tablet Take 1 tablet (500 mg total) by mouth 2 (two) times daily with a meal.  60 tablet  2  . Omega-3 Fatty Acids (FISH OIL PO) Take 1 capsule by mouth daily.       No current facility-administered medications for this visit.    Allergies  Allergen Reactions  . Morphine And Related Other (See Comments)    Migraine headaches    Family History  Problem Relation Age of Onset  . Arthritis Mother   . Cancer Mother   .  Hyperlipidemia Mother   . Heart disease Mother   . Diabetes Mother   . Heart disease Father   . Hyperlipidemia Father   . Arthritis Maternal Grandmother   . Diabetes Maternal Grandmother   . Arthritis Maternal Grandfather   . Hyperlipidemia Maternal Grandfather   . Heart disease Maternal Grandfather   . Stroke Maternal Grandfather   . Cancer Paternal Grandmother   . Diabetes Paternal Grandfather     History   Social History  . Marital Status: Single    Spouse Name: N/A    Number of Children: N/A  . Years of Education: N/A   Occupational History  . Not on file.   Social History Main Topics  . Smoking status: Former Research scientist (life sciences)  . Smokeless tobacco: Never Used  . Alcohol Use: Yes     Comment: occasional  . Drug Use: No  . Sexual Activity: Not on file   Other Topics Concern  . Not on file   Social History Narrative  . No narrative on file     Constitutional: Denies fever, malaise, fatigue, headache or abrupt weight changes.  Respiratory: Pt reports shortness of breath. Denies difficulty breathing, cough or sputum production.   Cardiovascular: Pt reports chest tightness. Denies chest pain, palpitations or swelling in the  hands or feet.  Gastrointestinal: Pt reports diarrhea. Denies abdominal pain, bloating, constipation,  or blood in the stool.  Neurological: Pt reports dizziness. Denies difficulty with memory, difficulty with speech or problems with balance and coordination.   No other specific complaints in a complete review of systems (except as listed in HPI above).  Objective:   Physical Exam   BP 128/84  Pulse 79  Temp(Src) 98.2 F (36.8 C) (Oral)  Wt 259 lb (117.482 kg)  SpO2 98% Wt Readings from Last 3 Encounters:  02/03/14 259 lb (117.482 kg)  12/02/13 266 lb (120.657 kg)  11/08/13 266 lb 8 oz (120.884 kg)    General: Appears her stated age, obese but well developed, well nourished in NAD. Cardiovascular: Normal rate and rhythm. Muffled S1,S2 noted.   No murmur, rubs or gallops noted. No JVD or BLE edema. No carotid bruits noted. Pulmonary/Chest: Normal effort and positive vesicular breath sounds. No respiratory distress. No wheezes, rales or ronchi noted.  Abdomen: Soft and nontender. Normal bowel sounds, no bruits noted. No distention or masses noted. Liver, spleen and kidneys non palpable.  BMET    Component Value Date/Time   NA 138 10/29/2013 1009   K 4.2 10/29/2013 1009   CL 103 10/29/2013 1009   CO2 26 10/29/2013 1009   GLUCOSE 141* 10/29/2013 1009   BUN 12 10/29/2013 1009   CREATININE 0.7 10/29/2013 1009   CALCIUM 9.3 10/29/2013 1009    Lipid Panel     Component Value Date/Time   CHOL 233* 10/29/2013 1009   TRIG 282.0* 10/29/2013 1009   HDL 41.90 10/29/2013 1009   CHOLHDL 6 10/29/2013 1009   VLDL 56.4* 10/29/2013 1009    CBC    Component Value Date/Time   WBC 10.4 10/29/2013 1009   RBC 4.73 10/29/2013 1009   HGB 13.8 10/29/2013 1009   HCT 40.9 10/29/2013 1009   PLT 284.0 10/29/2013 1009   MCV 86.6 10/29/2013 1009   MCHC 33.7 10/29/2013 1009   RDW 13.2 10/29/2013 1009    Hgb A1C Lab Results  Component Value Date   HGBA1C 7.2* 10/29/2013        Assessment & Plan:   Chest tight, shortness of breath and dizziness:  Will obtain ECG today-abnormal Will refer to cardiology for possible stress test and further evaluation of abnormal ECG Will recheck her lipid profile today- most likely will need to be started on a satin RTC in 6 months or sooner if needed

## 2014-02-10 ENCOUNTER — Ambulatory Visit (INDEPENDENT_AMBULATORY_CARE_PROVIDER_SITE_OTHER): Payer: BC Managed Care – PPO | Admitting: Cardiovascular Disease

## 2014-02-10 ENCOUNTER — Encounter: Payer: Self-pay | Admitting: Cardiovascular Disease

## 2014-02-10 VITALS — BP 145/87 | HR 77 | Ht 64.0 in | Wt 260.8 lb

## 2014-02-10 DIAGNOSIS — R0989 Other specified symptoms and signs involving the circulatory and respiratory systems: Secondary | ICD-10-CM

## 2014-02-10 DIAGNOSIS — E781 Pure hyperglyceridemia: Secondary | ICD-10-CM

## 2014-02-10 DIAGNOSIS — R0602 Shortness of breath: Secondary | ICD-10-CM

## 2014-02-10 DIAGNOSIS — R0609 Other forms of dyspnea: Secondary | ICD-10-CM | POA: Insufficient documentation

## 2014-02-10 DIAGNOSIS — R079 Chest pain, unspecified: Secondary | ICD-10-CM

## 2014-02-10 NOTE — Assessment & Plan Note (Signed)
Followed by her medical doctor  

## 2014-02-10 NOTE — Assessment & Plan Note (Signed)
Tasha Leonard presents for further evaluation of some dyspnea on exertion. Has a history his mellitus hypertension as well as obesity. She has not been exercising on a regular basis. She was recently diagnosed as having diabetes and was told exercise. She's noticed that she has exertional shortness of breath.  Her EKG shows some nonspecific ST/ T  changes. She also has some chest pains that are noncardiac.  Given her history of diabetes, hypertension, obesity and mildly abnormal ECG  I think we should do a Lexiscan myoview.    If the test is negative, I will see her back on an as needed basis.  If the test is abnormal, we will see her back for further evalution.    I have encouraged her to ambulate.  It is probably normal that she has some DOE with walking since she is not used to exercising.

## 2014-02-10 NOTE — Progress Notes (Signed)
Tasha Leonard Date of Birth  July 04, 1961       [facility] [address], [address], [place], [place]  [address]   [place], [place]  [address] [place]   Fax  [phone]     Fax [phone]  Problem List: 1. Chest pain 2. Obesity 3. Hypertension 4. DOE 5. Diabetes Mellitus.   History of Present Illness:  Tasha Leonard presents today for evaluation of chest pain.   Tasha Leonard has started an exercise program.  Tasha Leonard has had some shortness of breath.  Tasha Leonard has not been exercising on a regular basis.    Tasha Leonard  occasionally sharp pains that last less than 1 second.   The pains occur at rest and with exertion. They're not associated with any specific activity, eating, drinking, or taking a deep breath.    Tasha Leonard works in [employer] here in [place].    Tasha Leonard has greatly improved her diet since being diagnosed with diabetes in January, 2015. + family hx of cardiac disease  Tasha Leonard has had some swelling of her hands.       Current Outpatient Prescriptions on File Prior to Visit  Medication Sig Dispense Refill  . ibuprofen (ADVIL,MOTRIN) 400 MG tablet Take 400 mg by mouth daily as needed.      Marland Kitchen lisinopril-hydrochlorothiazide (PRINZIDE,ZESTORETIC) 10-12.5 MG per tablet Take 1 tablet by mouth daily.  30 tablet  2  . Loratadine (CLARITIN) 10 MG CAPS Take 1 capsule by mouth daily.      . metFORMIN (GLUCOPHAGE) 500 MG tablet TAKE 1 TABLET (500 MG TOTAL) BY MOUTH 2 (TWO) TIMES DAILY WITH A MEAL.  60 tablet  2  . Omega-3 Fatty Acids (FISH OIL PO) Take 1 capsule by mouth daily.       No current facility-administered medications on file prior to visit.    Allergies  Allergen Reactions  . Morphine And Related Other (See Comments)    Migraine headaches    Past Medical History  Diagnosis Date  . Chicken pox   . Depression   . Frequent headaches   . Allergy   . Hypertension   . Kidney stones   .  Hyperlipidemia   . Diabetes mellitus without complication   . Syncope and collapse     Past Surgical History  Procedure Laterality Date  . Wrist surgery Left 1986    Left finger  . Retinal detachment surgery Right     History  Smoking status  . Former Smoker -- 2 years  . Types: Cigarettes  Smokeless tobacco  . Never Used    History  Alcohol Use  . Yes    Comment: occasional    Family History  Problem Relation Age of Onset  . Arthritis Mother   . Cancer Mother   . Hyperlipidemia Mother   . Diabetes Mother   . Hyperlipidemia Father   . Arthritis Maternal Grandmother   . Diabetes Maternal Grandmother   . Arthritis Maternal Grandfather   . Hyperlipidemia Maternal Grandfather   . Heart disease Maternal Grandfather   . Stroke Maternal Grandfather   . Cancer Paternal Grandmother   . Diabetes Paternal Grandfather     Reviw of Systems:  Reviewed in the HPI.  All other systems are negative.  Physical Exam: Height 5\' 4"  (1.626 m), weight 260 lb 12 oz (118.275 kg). Wt Readings from Last 3 Encounters:  02/10/14 260 lb  12 oz (118.275 kg)  02/03/14 259 lb (117.482 kg)  12/02/13 266 lb (120.657 kg)   General: Well developed, well nourished, in no acute distress. Head: Normocephalic, atraumatic, sclera non-icteric, mucus membranes are moist,  Neck: Supple. Carotids are 2 + without bruits. No JVD  Lungs: Clear  Heart: RR, normal S1S2, no murmurs Abdomen: Soft, non-tender, non-distended with normal bowel sounds. Msk:  Strength and tone are normal  Extremities: No clubbing or cyanosis. No edema.  Distal pedal pulses are 2+ and equal  Neuro: CN II - XII intact.  Alert and oriented X 3.  Psych:  Normal   ECG: Feb 10, 2014:  NSR.  NS ST T abn.   Assessment / Plan:

## 2014-02-10 NOTE — Patient Instructions (Addendum)
[facility]  Your caregiver has ordered a Stress Test with nuclear imaging. The purpose of this test is to evaluate the blood supply to your heart muscle. This procedure is referred to as a "Non-Invasive Stress Test." This is because other than having an IV started in your vein, nothing is inserted or "invades" your body. Cardiac stress tests are done to find areas of poor blood flow to the heart by determining the extent of coronary artery disease (CAD). Some patients exercise on a treadmill, which naturally increases the blood flow to your heart, while others who are  unable to walk on a treadmill due to physical limitations have a pharmacologic/chemical stress agent called Lexiscan . This medicine will mimic walking on a treadmill by temporarily increasing your coronary blood flow.   Please note: these test may take anywhere between 2-4 hours to complete  PLEASE REPORT TO [facility] AT THE FIRST DESK WILL DIRECT YOU WHERE TO GO  Date of Procedure:______________05/15/15_______________________  Arrival Time for Procedure:____________0745 AM__________________  Instructions regarding medication:   __x__ : Hold diabetes medication morning of procedure   PLEASE NOTIFY THE OFFICE AT LEAST 24 HOURS IN ADVANCE IF YOU ARE UNABLE TO KEEP YOUR APPOINTMENT.  [phone] AND  PLEASE NOTIFY NUCLEAR MEDICINE AT [facility] AT LEAST 24 HOURS IN ADVANCE IF YOU ARE UNABLE TO KEEP YOUR APPOINTMENT. [phone]  How to prepare for your Myoview test:  1. Do not eat or drink after midnight 2. No caffeine for 24 hours prior to test 3. No smoking 24 hours prior to test. 4. Your medication may be taken with water.  If your doctor stopped a medication because of this test, do not take that medication. 5. Ladies, please do not wear dresses.  Skirts or pants are appropriate. Please wear a short sleeve shirt. 6. No perfume, cologne or lotion. 7. Wear comfortable walking shoes. No  heels!  Your physician recommends that you schedule a follow-up appointment in:  As needed

## 2014-02-14 ENCOUNTER — Other Ambulatory Visit: Payer: Self-pay

## 2014-02-14 ENCOUNTER — Ambulatory Visit: Payer: Self-pay

## 2014-02-14 DIAGNOSIS — R0609 Other forms of dyspnea: Secondary | ICD-10-CM

## 2014-02-14 DIAGNOSIS — R0989 Other specified symptoms and signs involving the circulatory and respiratory systems: Secondary | ICD-10-CM

## 2014-04-29 ENCOUNTER — Ambulatory Visit: Payer: Self-pay | Admitting: Ophthalmology

## 2014-05-03 ENCOUNTER — Other Ambulatory Visit: Payer: Self-pay | Admitting: Internal Medicine

## 2014-06-05 ENCOUNTER — Other Ambulatory Visit: Payer: Self-pay | Admitting: Internal Medicine

## 2014-06-30 ENCOUNTER — Ambulatory Visit: Payer: Self-pay | Admitting: Ophthalmology

## 2014-06-30 LAB — POTASSIUM: Potassium: 3.8 mmol/L (ref 3.5–5.1)

## 2014-07-08 ENCOUNTER — Ambulatory Visit: Payer: Self-pay | Admitting: Ophthalmology

## 2014-07-21 ENCOUNTER — Ambulatory Visit (INDEPENDENT_AMBULATORY_CARE_PROVIDER_SITE_OTHER): Payer: BC Managed Care – PPO | Admitting: Internal Medicine

## 2014-07-21 ENCOUNTER — Encounter: Payer: Self-pay | Admitting: Internal Medicine

## 2014-07-21 VITALS — BP 158/90 | HR 88 | Temp 97.9°F | Wt 259.0 lb

## 2014-07-21 DIAGNOSIS — M545 Low back pain, unspecified: Secondary | ICD-10-CM | POA: Insufficient documentation

## 2014-07-21 DIAGNOSIS — M5441 Lumbago with sciatica, right side: Secondary | ICD-10-CM

## 2014-07-21 MED ORDER — TRAMADOL HCL 50 MG PO TABS: 50.0000 mg | ORAL_TABLET | Freq: Three times a day (TID) | ORAL | Status: DC | PRN

## 2014-07-21 MED ORDER — CYCLOBENZAPRINE HCL 10 MG PO TABS: 5.0000 mg | ORAL_TABLET | Freq: Every evening | ORAL | Status: DC | PRN

## 2014-07-21 NOTE — Progress Notes (Signed)
Subjective:    Patient ID: Tasha Leonard, female    DOB: 1961-04-06, 53 y.o.   MRN: [id number]  HPI "It hurts like hell" Lower right side and back Has happened before--diagnosed with sciatica (10 years ago and occasional twinge since then) Started mid afternoon yesterday--- Wii bowling with granddaughter Struggling to sit, stand or move  Pain does move up to middle of back and down leg No leg weakness  Took ibuprofen 800mg  last night and this morning (twice) Seems to do a little better after moving around a bit  Current Outpatient Prescriptions on File Prior to Visit  Medication Sig Dispense Refill  . ibuprofen (ADVIL,MOTRIN) 400 MG tablet Take 400 mg by mouth daily as needed.      Marland Kitchen lisinopril-hydrochlorothiazide (PRINZIDE,ZESTORETIC) 10-12.5 MG per tablet TAKE 1 TABLET BY MOUTH DAILY.  30 tablet  2  . Loratadine (CLARITIN) 10 MG CAPS Take 1 capsule by mouth daily.      . metFORMIN (GLUCOPHAGE) 500 MG tablet TAKE 1 TABLET (500 MG TOTAL) BY MOUTH 2 (TWO) TIMES DAILY WITH A MEAL.  60 tablet  2  . Omega-3 Fatty Acids (FISH OIL PO) Take 1 capsule by mouth daily.       No current facility-administered medications on file prior to visit.    Allergies  Allergen Reactions  . Morphine And Related Other (See Comments)    Migraine headaches    Past Medical History  Diagnosis Date  . Chicken pox   . Depression   . Frequent headaches   . Allergy   . Hypertension   . Kidney stones   . Hyperlipidemia   . Diabetes mellitus without complication   . Syncope and collapse     Past Surgical History  Procedure Laterality Date  . Wrist surgery Left 1986    Left finger  . Retinal detachment surgery Right     Family History  Problem Relation Age of Onset  . Arthritis Mother   . Cancer Mother   . Hyperlipidemia Mother   . Diabetes Mother   . Hyperlipidemia Father   . Arthritis Maternal Grandmother   . Diabetes Maternal Grandmother   . Arthritis Maternal Grandfather   .  Hyperlipidemia Maternal Grandfather   . Heart disease Maternal Grandfather   . Stroke Maternal Grandfather   . Cancer Paternal Grandmother   . Diabetes Paternal Grandfather     History   Social History  . Marital Status: Single    Spouse Name: N/A    Number of Children: N/A  . Years of Education: N/A   Occupational History  . Not on file.   Social History Main Topics  . Smoking status: Former Smoker -- 2 years    Types: Cigarettes  . Smokeless tobacco: Never Used  . Alcohol Use: Yes     Comment: occasional  . Drug Use: No  . Sexual Activity: Not on file   Other Topics Concern  . Not on file   Social History Narrative  . No narrative on file   Review of Systems No loss of bladder or bowel function No vomiting or diarrhea    Objective:   Physical Exam  Constitutional: She appears well-developed and well-nourished. No distress.  Musculoskeletal:  Marked tenderness left lumbar back No clear spine tenderness ?SLR pain at >90 degrees on right, absent on left  Neurological:  Antalgic gait at first but then evened out No focal leg weakness          Assessment &  Plan:

## 2014-07-21 NOTE — Progress Notes (Signed)
Pre visit review using our clinic review tool, if applicable. No additional management support is needed unless otherwise documented below in the visit note. 

## 2014-07-21 NOTE — Assessment & Plan Note (Signed)
I suspect mostly muscle injury No evidence of disc injury Ice if overdoes it or other injury---otherwise heat Tramadol prn and cyclobezaprine for sleep

## 2014-08-06 ENCOUNTER — Ambulatory Visit: Payer: BC Managed Care – PPO | Admitting: Internal Medicine

## 2014-08-07 ENCOUNTER — Ambulatory Visit (INDEPENDENT_AMBULATORY_CARE_PROVIDER_SITE_OTHER): Payer: BC Managed Care – PPO | Admitting: Internal Medicine

## 2014-08-07 ENCOUNTER — Encounter: Payer: Self-pay | Admitting: Internal Medicine

## 2014-08-07 VITALS — BP 134/80 | HR 90 | Wt 257.0 lb

## 2014-08-07 DIAGNOSIS — I10 Essential (primary) hypertension: Secondary | ICD-10-CM

## 2014-08-07 DIAGNOSIS — E119 Type 2 diabetes mellitus without complications: Secondary | ICD-10-CM

## 2014-08-07 DIAGNOSIS — Z23 Encounter for immunization: Secondary | ICD-10-CM

## 2014-08-07 DIAGNOSIS — E785 Hyperlipidemia, unspecified: Secondary | ICD-10-CM

## 2014-08-07 LAB — COMPREHENSIVE METABOLIC PANEL
ALT: 14 U/L (ref 0–35)
AST: 18 U/L (ref 0–37)
Albumin: 3.3 g/dL — ABNORMAL LOW (ref 3.5–5.2)
Alkaline Phosphatase: 67 U/L (ref 39–117)
BUN: 16 mg/dL (ref 6–23)
CO2: 25 meq/L (ref 19–32)
Calcium: 8.8 mg/dL (ref 8.4–10.5)
Chloride: 107 mEq/L (ref 96–112)
Creatinine, Ser: 0.8 mg/dL (ref 0.4–1.2)
GFR: 78.85 mL/min (ref >60.00)
GLUCOSE: 159 mg/dL — AB (ref 70–99)
Potassium: 4.6 mEq/L (ref 3.5–5.1)
SODIUM: 143 meq/L (ref 135–145)
TOTAL PROTEIN: 7 g/dL (ref 6.0–8.3)
Total Bilirubin: 0.2 mg/dL (ref 0.2–1.2)

## 2014-08-07 LAB — LIPID PANEL
CHOLESTEROL: 192 mg/dL (ref 0–200)
HDL: 33 mg/dL — ABNORMAL LOW (ref >39.00)
NonHDL: 159
Total CHOL/HDL Ratio: 6
Triglycerides: 298 mg/dL — ABNORMAL HIGH (ref 0.0–149.0)
VLDL: 59.6 mg/dL — ABNORMAL HIGH (ref 0.0–40.0)

## 2014-08-07 LAB — LDL CHOLESTEROL, DIRECT: Direct LDL: 97.7 mg/dL

## 2014-08-07 LAB — HEMOGLOBIN A1C: Hgb A1c MFr Bld: 6.7 % — ABNORMAL HIGH (ref 4.6–6.5)

## 2014-08-07 NOTE — Assessment & Plan Note (Signed)
Has lost 9 lbs over the last 6 months Encouraged her to continue to work on diet and exercise

## 2014-08-07 NOTE — Assessment & Plan Note (Signed)
Well controlled on Lisinopril-HCT Will repeat CMET today 

## 2014-08-07 NOTE — Progress Notes (Signed)
Subjective:    Patient ID: Tasha Leonard, female    DOB: 03/14/1961, 53 y.o.   MRN: [id number]  HPI  Pt presents to the clinic today for 6 month followup.  HTN: Well controlled on Lisinopril-HCT. BP today 134/80.  HLD: Total cholesterol 202, LDL 115, HDL 40. Triglycerides remain elevated at 231. She is taking fish oil daily.  DM2: Does not to be checking sugars. Last A1C was 5.4 %. Microalbumin was normal. Tolerating Metformin 500 mg BID. Last eye exam 10/15. Flu shot 10/14. Never had a pneumonia shot.  Obesity: has lost 9 lbs over the last 6 months. She had been working on her diet, eating less carbs.  Review of Systems  Past Medical History  Diagnosis Date  . Chicken pox   . Depression   . Frequent headaches   . Allergy   . Hypertension   . Kidney stones   . Hyperlipidemia   . Diabetes mellitus without complication   . Syncope and collapse     Current Outpatient Prescriptions  Medication Sig Dispense Refill  . cyclobenzaprine (FLEXERIL) 10 MG tablet Take 0.5-1 tablets (5-10 mg total) by mouth at bedtime as needed for muscle spasms. 15 tablet 0  . ibuprofen (ADVIL,MOTRIN) 400 MG tablet Take 400 mg by mouth daily as needed.    Marland Kitchen lisinopril-hydrochlorothiazide (PRINZIDE,ZESTORETIC) 10-12.5 MG per tablet TAKE 1 TABLET BY MOUTH DAILY. 30 tablet 2  . Loratadine (CLARITIN) 10 MG CAPS Take 1 capsule by mouth daily.    . metFORMIN (GLUCOPHAGE) 500 MG tablet TAKE 1 TABLET (500 MG TOTAL) BY MOUTH 2 (TWO) TIMES DAILY WITH A MEAL. 60 tablet 2  . Nepafenac (ILEVRO) 0.3 % SUSP Apply 1 drop to eye.    . Omega-3 Fatty Acids (FISH OIL PO) Take 1 capsule by mouth daily.    . traMADol (ULTRAM) 50 MG tablet Take 1 tablet (50 mg total) by mouth 3 (three) times daily as needed for moderate pain. 40 tablet 0   No current facility-administered medications for this visit.    Allergies  Allergen Reactions  . Morphine And Related Other (See Comments)    Migraine headaches    Family  History  Problem Relation Age of Onset  . Arthritis Mother   . Cancer Mother   . Hyperlipidemia Mother   . Diabetes Mother   . Hyperlipidemia Father   . Arthritis Maternal Grandmother   . Diabetes Maternal Grandmother   . Arthritis Maternal Grandfather   . Hyperlipidemia Maternal Grandfather   . Heart disease Maternal Grandfather   . Stroke Maternal Grandfather   . Cancer Paternal Grandmother   . Diabetes Paternal Grandfather     History   Social History  . Marital Status: Single    Spouse Name: N/A    Number of Children: N/A  . Years of Education: N/A   Occupational History  . Not on file.   Social History Main Topics  . Smoking status: Former Smoker -- 2 years    Types: Cigarettes  . Smokeless tobacco: Never Used  . Alcohol Use: Yes     Comment: occasional  . Drug Use: No  . Sexual Activity: Not on file   Other Topics Concern  . Not on file   Social History Narrative     Constitutional: Denies fever, malaise, fatigue, headache or abrupt weight changes.  HEENT: Denies eye pain, eye redness, ear pain, ringing in the ears, wax buildup, runny nose, nasal congestion, bloody nose, or sore throat. Respiratory: Pt  reports dyspnea on exertion. Denies difficulty breathing, shortness of breath, cough or sputum production.   Cardiovascular: Denies chest pain, chest tightness, palpitations or swelling in the hands or feet.  Gastrointestinal: Denies abdominal pain, bloating, constipation, diarrhea or blood in the stool.  Skin: Denies redness, rashes, lesions or ulcercations.  Neurological: Denies dizziness, difficulty with memory, difficulty with speech or problems with balance and coordination.   No other specific complaints in a complete review of systems (except as listed in HPI above).     Objective:   Physical Exam   BP 134/80 mmHg  Pulse 90  Wt 257 lb (116.574 kg)  SpO2 98% Wt Readings from Last 3 Encounters:  08/07/14 257 lb (116.574 kg)  07/21/14 259 lb  (117.482 kg)  02/10/14 260 lb 12 oz (118.275 kg)    General: Appears her stated age, obese but well developed, well nourished in NAD. Skin: Warm, dry and intact. No rashes, lesions or ulcerations noted. Cardiovascular: Normal rate and rhythm. S1,S2 noted.  No murmur, rubs or gallops noted. No JVD or BLE edema. No carotid bruits noted. Pulmonary/Chest: Normal effort and positive vesicular breath sounds. No respiratory distress. No wheezes, rales or ronchi noted.  Abdomen: Soft and nontender. Normal bowel sounds, no bruits noted. No distention or masses noted. Liver, spleen and kidneys non palpable.   BMET    Component Value Date/Time   NA 138 10/29/2013 1009   K 4.2 10/29/2013 1009   CL 103 10/29/2013 1009   CO2 26 10/29/2013 1009   GLUCOSE 141* 10/29/2013 1009   BUN 12 10/29/2013 1009   CREATININE 0.7 10/29/2013 1009   CALCIUM 9.3 10/29/2013 1009    Lipid Panel     Component Value Date/Time   CHOL 202* 02/03/2014 0932   TRIG 231.0* 02/03/2014 0932   HDL 40.70 02/03/2014 0932   CHOLHDL 5 02/03/2014 0932   VLDL 46.2* 02/03/2014 0932   LDLCALC 115* 02/03/2014 0932    CBC    Component Value Date/Time   WBC 10.4 10/29/2013 1009   RBC 4.73 10/29/2013 1009   HGB 13.8 10/29/2013 1009   HCT 40.9 10/29/2013 1009   PLT 284.0 10/29/2013 1009   MCV 86.6 10/29/2013 1009   MCHC 33.7 10/29/2013 1009   RDW 13.2 10/29/2013 1009    Hgb A1C Lab Results  Component Value Date   HGBA1C 6.4 02/03/2014        Assessment & Plan:   RTC in 6 months for physical exam

## 2014-08-07 NOTE — Assessment & Plan Note (Signed)
Borderline Will repeat CMET and Lipid profile today If LDL not at goal, will start statin Handout given on low fat diet

## 2014-08-07 NOTE — Assessment & Plan Note (Signed)
Last A1C reviewed Has had annual eye exam Flu and Pneumovax today Foot exam today Will repeat A1C Reinforced diabetic diet

## 2014-08-07 NOTE — Patient Instructions (Signed)
Fat and Cholesterol Control Diet Fat and cholesterol levels in your blood and organs are influenced by your diet. High levels of fat and cholesterol may lead to diseases of the heart, small and large blood vessels, gallbladder, liver, and pancreas. CONTROLLING FAT AND CHOLESTEROL WITH DIET Although exercise and lifestyle factors are important, your diet is key. That is because certain foods are known to raise cholesterol and others to lower it. The goal is to balance foods for their effect on cholesterol and more importantly, to replace saturated and trans fat with other types of fat, such as monounsaturated fat, polyunsaturated fat, and omega-3 fatty acids. On average, a person should consume no more than 15 to 17 g of saturated fat daily. Saturated and trans fats are considered "bad" fats, and they will raise LDL cholesterol. Saturated fats are primarily found in animal products such as meats, butter, and cream. However, that does not mean you need to give up all your favorite foods. Today, there are good tasting, low-fat, low-cholesterol substitutes for most of the things you like to eat. Choose low-fat or nonfat alternatives. Choose round or loin cuts of red meat. These types of cuts are lowest in fat and cholesterol. Chicken (without the skin), fish, veal, and ground turkey breast are great choices. Eliminate fatty meats, such as hot dogs and salami. Even shellfish have little or no saturated fat. Have a 3 oz (85 g) portion when you eat lean meat, poultry, or fish. Trans fats are also called "partially hydrogenated oils." They are oils that have been scientifically manipulated so that they are solid at room temperature resulting in a longer shelf life and improved taste and texture of foods in which they are added. Trans fats are found in stick margarine, some tub margarines, cookies, crackers, and baked goods.  When baking and cooking, oils are a great substitute for butter. The monounsaturated oils are  especially beneficial since it is believed they lower LDL and raise HDL. The oils you should avoid entirely are saturated tropical oils, such as coconut and palm.  Remember to eat a lot from food groups that are naturally free of saturated and trans fat, including fish, fruit, vegetables, beans, grains (barley, rice, couscous, bulgur wheat), and pasta (without cream sauces).  IDENTIFYING FOODS THAT LOWER FAT AND CHOLESTEROL  Soluble fiber may lower your cholesterol. This type of fiber is found in fruits such as apples, vegetables such as broccoli, potatoes, and carrots, legumes such as beans, peas, and lentils, and grains such as barley. Foods fortified with plant sterols (phytosterol) may also lower cholesterol. You should eat at least 2 g per day of these foods for a cholesterol lowering effect.  Read package labels to identify low-saturated fats, trans fat free, and low-fat foods at the supermarket. Select cheeses that have only 2 to 3 g saturated fat per ounce. Use a heart-healthy tub margarine that is free of trans fats or partially hydrogenated oil. When buying baked goods (cookies, crackers), avoid partially hydrogenated oils. Breads and muffins should be made from whole grains (whole-wheat or whole oat flour, instead of "flour" or "enriched flour"). Buy non-creamy canned soups with reduced salt and no added fats.  FOOD PREPARATION TECHNIQUES  Never deep-fry. If you must fry, either stir-fry, which uses very little fat, or use non-stick cooking sprays. When possible, broil, bake, or roast meats, and steam vegetables. Instead of putting butter or margarine on vegetables, use lemon and herbs, applesauce, and cinnamon (for squash and sweet potatoes). Use nonfat   yogurt, salsa, and low-fat dressings for salads.  LOW-SATURATED FAT / LOW-FAT FOOD SUBSTITUTES Meats / Saturated Fat (g)  Avoid: Steak, marbled (3 oz/85 g) / 11 g  Choose: Steak, lean (3 oz/85 g) / 4 g  Avoid: Hamburger (3 oz/85 g) / 7  g  Choose: Hamburger, lean (3 oz/85 g) / 5 g  Avoid: Ham (3 oz/85 g) / 6 g  Choose: Ham, lean cut (3 oz/85 g) / 2.4 g  Avoid: Chicken, with skin, dark meat (3 oz/85 g) / 4 g  Choose: Chicken, skin removed, dark meat (3 oz/85 g) / 2 g  Avoid: Chicken, with skin, light meat (3 oz/85 g) / 2.5 g  Choose: Chicken, skin removed, light meat (3 oz/85 g) / 1 g Dairy / Saturated Fat (g)  Avoid: Whole milk (1 cup) / 5 g  Choose: Low-fat milk, 2% (1 cup) / 3 g  Choose: Low-fat milk, 1% (1 cup) / 1.5 g  Choose: Skim milk (1 cup) / 0.3 g  Avoid: Hard cheese (1 oz/28 g) / 6 g  Choose: Skim milk cheese (1 oz/28 g) / 2 to 3 g  Avoid: Cottage cheese, 4% fat (1 cup) / 6.5 g  Choose: Low-fat cottage cheese, 1% fat (1 cup) / 1.5 g  Avoid: Ice cream (1 cup) / 9 g  Choose: Sherbet (1 cup) / 2.5 g  Choose: Nonfat frozen yogurt (1 cup) / 0.3 g  Choose: Frozen fruit bar / trace  Avoid: Whipped cream (1 tbs) / 3.5 g  Choose: Nondairy whipped topping (1 tbs) / 1 g Condiments / Saturated Fat (g)  Avoid: Mayonnaise (1 tbs) / 2 g  Choose: Low-fat mayonnaise (1 tbs) / 1 g  Avoid: Butter (1 tbs) / 7 g  Choose: Extra light margarine (1 tbs) / 1 g  Avoid: Coconut oil (1 tbs) / 11.8 g  Choose: Olive oil (1 tbs) / 1.8 g  Choose: Corn oil (1 tbs) / 1.7 g  Choose: Safflower oil (1 tbs) / 1.2 g  Choose: Sunflower oil (1 tbs) / 1.4 g  Choose: Soybean oil (1 tbs) / 2.4 g  Choose: Canola oil (1 tbs) / 1 g Document Released: 09/19/2005 Document Revised: 01/14/2013 Document Reviewed: 12/18/2013 [employer] Patient Information 2015 [employer]. This information is not intended to replace advice given to you by your health care provider. Make sure you discuss any questions you have with your health care provider.  

## 2014-08-07 NOTE — Progress Notes (Signed)
Pre visit review using our clinic review tool, if applicable. No additional management support is needed unless otherwise documented below in the visit note. 

## 2014-08-21 ENCOUNTER — Other Ambulatory Visit: Payer: Self-pay

## 2014-08-21 MED ORDER — METFORMIN HCL 500 MG PO TABS: ORAL_TABLET | ORAL | Status: DC

## 2014-08-21 NOTE — Telephone Encounter (Signed)
Pt left vm requesting metformin refill to [facility]. Left v/m to let pt know refills done.

## 2014-09-02 ENCOUNTER — Other Ambulatory Visit: Payer: Self-pay

## 2014-09-02 MED ORDER — LISINOPRIL-HYDROCHLOROTHIAZIDE 10-12.5 MG PO TABS: 1.0000 | ORAL_TABLET | Freq: Every day | ORAL | Status: DC

## 2014-09-03 ENCOUNTER — Other Ambulatory Visit: Payer: Self-pay

## 2014-09-03 MED ORDER — CYCLOBENZAPRINE HCL 10 MG PO TABS: 5.0000 mg | ORAL_TABLET | Freq: Every evening | ORAL | Status: DC | PRN

## 2014-09-29 ENCOUNTER — Other Ambulatory Visit: Payer: Self-pay | Admitting: Internal Medicine

## 2014-09-29 MED ORDER — TRAMADOL HCL 50 MG PO TABS: 50.0000 mg | ORAL_TABLET | Freq: Three times a day (TID) | ORAL | Status: DC | PRN

## 2014-09-29 NOTE — Telephone Encounter (Signed)
rx called into pharmacy

## 2014-09-29 NOTE — Telephone Encounter (Signed)
Note from 08/20/2014 says she was not taking any pain medication. No mention of this in Dr. Hulen Shouts last note about taking tramadol for the pain. Was filled by Dr. Silvio Pate 08/08/14. Will defer to Dr. Deborra Medina upon her return

## 2014-09-29 NOTE — Telephone Encounter (Signed)
Last called in 07/21/14

## 2014-09-29 NOTE — Telephone Encounter (Signed)
Ignore previous message- wrong pt Ok to phone in tramadol

## 2014-11-17 ENCOUNTER — Encounter: Payer: Self-pay | Admitting: Internal Medicine

## 2014-11-17 ENCOUNTER — Ambulatory Visit (INDEPENDENT_AMBULATORY_CARE_PROVIDER_SITE_OTHER): Payer: BLUE CROSS/BLUE SHIELD | Admitting: Internal Medicine

## 2014-11-17 VITALS — BP 122/76 | HR 77 | Temp 98.6°F | Wt 256.0 lb

## 2014-11-17 DIAGNOSIS — J069 Acute upper respiratory infection, unspecified: Secondary | ICD-10-CM

## 2014-11-17 DIAGNOSIS — B9789 Other viral agents as the cause of diseases classified elsewhere: Principal | ICD-10-CM

## 2014-11-17 MED ORDER — PROMETHAZINE-DM 6.25-15 MG/5ML PO SYRP: 5.0000 mL | ORAL_SOLUTION | Freq: Four times a day (QID) | ORAL | Status: DC | PRN

## 2014-11-17 NOTE — Progress Notes (Signed)
Pre visit review using our clinic review tool, if applicable. No additional management support is needed unless otherwise documented below in the visit note. 

## 2014-11-17 NOTE — Progress Notes (Signed)
HPI  Pt presents to the clinic today with c/o headache, post nasal drip and cough. She reports this started 3 days ago. The cough is non productive. She is blowing clear mucous out of her nose. She denies fever, chills or body aches. She has tried Dayquil and salt water gargles without much relief. She does have a history of seasonal allergies, and takes Claritin daily. She has not had sick contacts. She is up to date with her flu and pneumonia shots.  Review of Systems      Past Medical History  Diagnosis Date  . Chicken pox   . Depression   . Frequent headaches   . Allergy   . Hypertension   . Kidney stones   . Hyperlipidemia   . Diabetes mellitus without complication   . Syncope and collapse     Family History  Problem Relation Age of Onset  . Arthritis Mother   . Cancer Mother   . Hyperlipidemia Mother   . Diabetes Mother   . Hyperlipidemia Father   . Arthritis Maternal Grandmother   . Diabetes Maternal Grandmother   . Arthritis Maternal Grandfather   . Hyperlipidemia Maternal Grandfather   . Heart disease Maternal Grandfather   . Stroke Maternal Grandfather   . Cancer Paternal Grandmother   . Diabetes Paternal Grandfather     History   Social History  . Marital Status: Single    Spouse Name: N/A  . Number of Children: N/A  . Years of Education: N/A   Occupational History  . Not on file.   Social History Main Topics  . Smoking status: Former Smoker -- 2 years    Types: Cigarettes  . Smokeless tobacco: Never Used  . Alcohol Use: Yes     Comment: occasional  . Drug Use: No  . Sexual Activity: Not on file   Other Topics Concern  . Not on file   Social History Narrative    Allergies  Allergen Reactions  . Morphine And Related Other (See Comments)    Migraine headaches     Constitutional: Positive headache, fatigue. Denies fever or abrupt weight changes.  HEENT:  Positive runny nose, sore throat. Denies eye redness, eye pain, pressure behind the  eyes, facial pain, nasal congestion, ear pain, ringing in the ears, wax buildup, or bloody nose. Respiratory: Positive cough. Denies difficulty breathing or shortness of breath.  Cardiovascular: Denies chest pain, chest tightness, palpitations or swelling in the hands or feet.   No other specific complaints in a complete review of systems (except as listed in HPI above).  Objective:   BP 122/76 mmHg  Pulse 77  Temp(Src) 98.6 F (37 C) (Oral)  Wt 256 lb (116.121 kg)  SpO2 98% Wt Readings from Last 3 Encounters:  11/17/14 256 lb (116.121 kg)  08/07/14 257 lb (116.574 kg)  07/21/14 259 lb (117.482 kg)     General: Appears her stated age, well developed, well nourished in NAD. HEENT: Head: normal shape and size, maxillary sinus tendernessnoted; Eyes: sclera white, no icterus, conjunctiva pink; Ears: Tm's gray and intact, normal light reflex; Nose: mucosa pink and moist, septum midline; Throat/Mouth: + PND. Teeth present, mucosa erythematous and moist, no exudate noted, no lesions or ulcerations noted.  Neck: Cervical lymphadenopathy noted.  Cardiovascular: Normal rate and rhythm. S1,S2 noted.  No murmur, rubs or gallops noted.  Pulmonary/Chest: Normal effort and positive vesicular breath sounds. No respiratory distress. No wheezes, rales or ronchi noted.      Assessment &  Plan:   Viral Upper Respiratory Infection with Cough:  Get some rest and drink plenty of water Continue salt water gargles for the sore throat Try Flonase Daily x 3 days eRx for Dextro-prometh cough syrup  If symptoms persist or worsen by Friday, call me, will call in zpack  RTC as needed or if symptoms persist.

## 2014-11-17 NOTE — Patient Instructions (Signed)
Upper Respiratory Infection, Adult An upper respiratory infection (URI) is also sometimes known as the common cold. The upper respiratory tract includes the nose, sinuses, throat, trachea, and bronchi. Bronchi are the airways leading to the lungs. Most people improve within 1 week, but symptoms can last up to 2 weeks. A residual cough may last even longer.  CAUSES Many different viruses can infect the tissues lining the upper respiratory tract. The tissues become irritated and inflamed and often become very moist. Mucus production is also common. A cold is contagious. You can easily spread the virus to others by oral contact. This includes kissing, sharing a glass, coughing, or sneezing. Touching your mouth or nose and then touching a surface, which is then touched by another person, can also spread the virus. SYMPTOMS  Symptoms typically develop 1 to 3 days after you come in contact with a cold virus. Symptoms vary from person to person. They may include:  Runny nose.  Sneezing.  Nasal congestion.  Sinus irritation.  Sore throat.  Loss of voice (laryngitis).  Cough.  Fatigue.  Muscle aches.  Loss of appetite.  Headache.  Low-grade fever. DIAGNOSIS  You might diagnose your own cold based on familiar symptoms, since most people get a cold 2 to 3 times a year. Your caregiver can confirm this based on your exam. Most importantly, your caregiver can check that your symptoms are not due to another disease such as strep throat, sinusitis, pneumonia, asthma, or epiglottitis. Blood tests, throat tests, and X-rays are not necessary to diagnose a common cold, but they may sometimes be helpful in excluding other more serious diseases. Your caregiver will decide if any further tests are required. RISKS AND COMPLICATIONS  You may be at risk for a more severe case of the common cold if you smoke cigarettes, have chronic heart disease (such as heart failure) or lung disease (such as asthma), or if  you have a weakened immune system. The very young and very old are also at risk for more serious infections. Bacterial sinusitis, middle ear infections, and bacterial pneumonia can complicate the common cold. The common cold can worsen asthma and chronic obstructive pulmonary disease (COPD). Sometimes, these complications can require emergency medical care and may be life-threatening. PREVENTION  The best way to protect against getting a cold is to practice good hygiene. Avoid oral or hand contact with people with cold symptoms. Wash your hands often if contact occurs. There is no clear evidence that vitamin C, vitamin E, echinacea, or exercise reduces the chance of developing a cold. However, it is always recommended to get plenty of rest and practice good nutrition. TREATMENT  Treatment is directed at relieving symptoms. There is no cure. Antibiotics are not effective, because the infection is caused by a virus, not by bacteria. Treatment may include:  Increased fluid intake. Sports drinks offer valuable electrolytes, sugars, and fluids.  Breathing heated mist or steam (vaporizer or shower).  Eating chicken soup or other clear broths, and maintaining good nutrition.  Getting plenty of rest.  Using gargles or lozenges for comfort.  Controlling fevers with ibuprofen or acetaminophen as directed by your caregiver.  Increasing usage of your inhaler if you have asthma. Zinc gel and zinc lozenges, taken in the first 24 hours of the common cold, can shorten the duration and lessen the severity of symptoms. Pain medicines may help with fever, muscle aches, and throat pain. A variety of non-prescription medicines are available to treat congestion and runny nose. Your caregiver   can make recommendations and may suggest nasal or lung inhalers for other symptoms.  HOME CARE INSTRUCTIONS   Only take over-the-counter or prescription medicines for pain, discomfort, or fever as directed by your  caregiver.  Use a warm mist humidifier or inhale steam from a shower to increase air moisture. This may keep secretions moist and make it easier to breathe.  Drink enough water and fluids to keep your urine clear or pale yellow.  Rest as needed.  Return to work when your temperature has returned to normal or as your caregiver advises. You may need to stay home longer to avoid infecting others. You can also use a face mask and careful hand washing to prevent spread of the virus. SEEK MEDICAL CARE IF:   After the first few days, you feel you are getting worse rather than better.  You need your caregiver's advice about medicines to control symptoms.  You develop chills, worsening shortness of breath, or brown or red sputum. These may be signs of pneumonia.  You develop yellow or brown nasal discharge or pain in the face, especially when you bend forward. These may be signs of sinusitis.  You develop a fever, swollen neck glands, pain with swallowing, or white areas in the back of your throat. These may be signs of strep throat. SEEK IMMEDIATE MEDICAL CARE IF:   You have a fever.  You develop severe or persistent headache, ear pain, sinus pain, or chest pain.  You develop wheezing, a prolonged cough, cough up blood, or have a change in your usual mucus (if you have chronic lung disease).  You develop sore muscles or a stiff neck. Document Released: 03/15/2001 Document Revised: 12/12/2011 Document Reviewed: 12/25/2013 Exit[employer] Patient Information 2015 [employer]. This information is not intended to replace advice given to you by your health care provider. Make sure you discuss any questions you have with your health care provider.  

## 2015-01-19 ENCOUNTER — Other Ambulatory Visit: Payer: Self-pay | Admitting: Internal Medicine

## 2015-01-20 ENCOUNTER — Other Ambulatory Visit: Payer: Self-pay

## 2015-01-20 MED ORDER — TRAMADOL HCL 50 MG PO TABS: 50.0000 mg | ORAL_TABLET | Freq: Three times a day (TID) | ORAL | Status: DC | PRN

## 2015-01-20 NOTE — Telephone Encounter (Signed)
Rx called in to pharmacy. 

## 2015-01-24 NOTE — Op Note (Signed)
PATIENT NAME:  Tasha Leonard, Tasha Leonard MR#:  [id number] DATE OF BIRTH:  December 29, 1960  DATE OF PROCEDURE:  07/08/2014  PREOPERATIVE DIAGNOSIS: Visually significant cataract of the left eye.   POSTOPERATIVE DIAGNOSIS: Visually significant cataract of the left eye.   OPERATIVE PROCEDURE: Cataract extraction by phacoemulsification with implant of intraocular lens to the left eye.   SURGEON: Birder Robson, MD  ANESTHESIA:  1. Managed anesthesia care.  2. 50-50 mixture of 0.75% bupivacaine and 4% Xylocaine given as a retrobulbar block.   COMPLICATIONS: None.   TECHNIQUE:  Stop and chop.   DESCRIPTION OF PROCEDURE: The patient was examined and consented for this procedure in the preoperative holding area and then brought back to the operating room where the anesthesia team employed managed anesthesia care.  3.5 milliliters of the aforementioned mixture were placed in the left orbit on an Atkinson needle without complication. The left eye was then prepped and draped in the usual sterile ophthalmic fashion. A lid speculum was placed. The side-port blade was used to create a paracentesis and the anterior chamber was filled with viscoelastic. The keratome was used to create a near clear corneal incision. The continuous curvilinear capsulorrhexis was performed with a cystotome followed by the capsulorrhexis forceps. Hydrodissection and hydrodelineation were carried out with BSS on a blunt cannula. The lens was removed in a stop and chop technique. The remaining cortical material was removed with the irrigation-aspiration handpiece. The capsular bag was inflated with viscoelastic and the Tecnis ZCB00 11.0-diopter lens, serial number [id number] was placed in the capsular bag without complication. The remaining viscoelastic was removed from the eye with the irrigation-aspiration handpiece. The wounds were hydrated. The anterior chamber was flushed with Miostat and the eye was inflated to a physiologic pressure. 0.1  mL of cefuroxime concentration 10 mg/mL was placed in the anterior chamber.  The wounds were found to be water tight. The eye was dressed with Vigamox followed by Maxitrol ointment and a protective shield was placed. The patient will followup with me in 1 day.    ____________________________ Livingston Diones. Anokhi Shannon, MD wlp:JT D: 07/08/2014 19:22:38 ET T: 07/08/2014 20:02:43 ET JOB#: [id number]  cc: Juwana Thoreson L. Lyndia Bury, MD, <Dictator> Livingston Diones Dafney Farler MD ELECTRONICALLY SIGNED 07/09/2014 12:12

## 2015-01-26 MED ORDER — CYCLOBENZAPRINE HCL 10 MG PO TABS: 5.0000 mg | ORAL_TABLET | Freq: Every evening | ORAL | Status: DC | PRN

## 2015-01-26 NOTE — Addendum Note (Signed)
Addended by: Lurlean Nanny on: 01/26/2015 08:32 AM   Modules accepted: Orders

## 2015-02-05 ENCOUNTER — Ambulatory Visit (INDEPENDENT_AMBULATORY_CARE_PROVIDER_SITE_OTHER): Payer: BLUE CROSS/BLUE SHIELD | Admitting: Internal Medicine

## 2015-02-05 ENCOUNTER — Encounter: Payer: Self-pay | Admitting: Internal Medicine

## 2015-02-05 ENCOUNTER — Other Ambulatory Visit (HOSPITAL_COMMUNITY)
Admission: RE | Admit: 2015-02-05 | Discharge: 2015-02-05 | Disposition: A | Discharge status: Home / Self Care | Payer: BLUE CROSS/BLUE SHIELD | Source: Ambulatory Visit | Attending: Internal Medicine | Admitting: Internal Medicine

## 2015-02-05 VITALS — BP 134/88 | HR 87 | Temp 98.9°F | Ht 64.25 in | Wt 251.0 lb

## 2015-02-05 DIAGNOSIS — Z1151 Encounter for screening for human papillomavirus (HPV): Secondary | ICD-10-CM | POA: Diagnosis present

## 2015-02-05 DIAGNOSIS — N644 Mastodynia: Secondary | ICD-10-CM | POA: Diagnosis not present

## 2015-02-05 DIAGNOSIS — R7989 Other specified abnormal findings of blood chemistry: Secondary | ICD-10-CM

## 2015-02-05 DIAGNOSIS — Z0000 Encounter for general adult medical examination without abnormal findings: Secondary | ICD-10-CM | POA: Diagnosis not present

## 2015-02-05 DIAGNOSIS — Z1211 Encounter for screening for malignant neoplasm of colon: Secondary | ICD-10-CM | POA: Diagnosis not present

## 2015-02-05 DIAGNOSIS — Z124 Encounter for screening for malignant neoplasm of cervix: Secondary | ICD-10-CM

## 2015-02-05 DIAGNOSIS — Z1239 Encounter for other screening for malignant neoplasm of breast: Secondary | ICD-10-CM

## 2015-02-05 DIAGNOSIS — Z01419 Encounter for gynecological examination (general) (routine) without abnormal findings: Secondary | ICD-10-CM | POA: Diagnosis not present

## 2015-02-05 LAB — COMPREHENSIVE METABOLIC PANEL
ALK PHOS: 68 U/L (ref 39–117)
ALT: 14 U/L (ref 0–35)
AST: 17 U/L (ref 0–37)
Albumin: 4 g/dL (ref 3.5–5.2)
BUN: 11 mg/dL (ref 6–23)
CALCIUM: 9.3 mg/dL (ref 8.4–10.5)
CHLORIDE: 103 meq/L (ref 96–112)
CO2: 27 mEq/L (ref 19–32)
CREATININE: 0.74 mg/dL (ref 0.40–1.20)
GFR: 87.35 mL/min (ref >60.00)
Glucose, Bld: 147 mg/dL — ABNORMAL HIGH (ref 70–99)
Potassium: 3.8 mEq/L (ref 3.5–5.1)
Sodium: 137 mEq/L (ref 135–145)
Total Bilirubin: 0.3 mg/dL (ref 0.2–1.2)
Total Protein: 7.5 g/dL (ref 6.0–8.3)

## 2015-02-05 LAB — CBC
HEMATOCRIT: 37 % (ref 36.0–46.0)
Hemoglobin: 12.9 g/dL (ref 12.0–15.0)
MCHC: 34.8 g/dL (ref 30.0–36.0)
MCV: 84.8 fl (ref 78.0–100.0)
Platelets: 309 10*3/uL (ref 150.0–400.0)
RBC: 4.36 Mil/uL (ref 3.87–5.11)
RDW: 13.6 % (ref 11.5–15.5)
WBC: 10.8 10*3/uL — AB (ref 4.0–10.5)

## 2015-02-05 LAB — LIPID PANEL
CHOL/HDL RATIO: 5
CHOLESTEROL: 192 mg/dL (ref 0–200)
HDL: 41.6 mg/dL (ref >39.00)
NONHDL: 150.4
Triglycerides: 309 mg/dL — ABNORMAL HIGH (ref 0.0–149.0)
VLDL: 61.8 mg/dL — ABNORMAL HIGH (ref 0.0–40.0)

## 2015-02-05 NOTE — Progress Notes (Signed)
Subjective:    Patient ID: Tasha Leonard, female    DOB: 1961-07-10, 54 y.o.   MRN: [id number]  HPI  Pt presents to the clinic today for her annual exam.  Flu: 08/2014 Tetanus: 2007 Pneumovax: 08/2014 Pap Smear: 2006 Mammogram: never Colon Screening: never Vision Screening: 05/2014 Dentist: as needed  Diet: She is trying to consume a Paleo diet at this time. She did stop drinking soda.  Exercise: She has lost 6 lbs since her last visit. She is walking and cardio 2-3 days per week.  She is having pain in her left breast. This started Monday morning. She reports it is a sharp pain. She denies dimpling of the skin or discharge from the nipple. She denies any trauma to the area. She has no family history of breast cancer.  Review of Systems      Past Medical History  Diagnosis Date  . Chicken pox   . Depression   . Frequent headaches   . Allergy   . Hypertension   . Kidney stones   . Hyperlipidemia   . Diabetes mellitus without complication   . Syncope and collapse     Current Outpatient Prescriptions  Medication Sig Dispense Refill  . cyclobenzaprine (FLEXERIL) 10 MG tablet Take 0.5-1 tablets (5-10 mg total) by mouth at bedtime as needed for muscle spasms. 15 tablet 0  . ibuprofen (ADVIL,MOTRIN) 400 MG tablet Take 400 mg by mouth daily as needed.    Marland Kitchen lisinopril-hydrochlorothiazide (PRINZIDE,ZESTORETIC) 10-12.5 MG per tablet Take 1 tablet by mouth daily. 30 tablet 5  . Loratadine (CLARITIN) 10 MG CAPS Take 1 capsule by mouth daily.    . metFORMIN (GLUCOPHAGE) 500 MG tablet TAKE 1 TABLET (500 MG TOTAL) BY MOUTH 2 (TWO) TIMES DAILY WITH A MEAL. 60 tablet 5  . Nepafenac (ILEVRO) 0.3 % SUSP Apply 1 drop to eye.    . Omega-3 Fatty Acids (FISH OIL PO) Take 1 capsule by mouth daily.    . promethazine-dextromethorphan (PROMETHAZINE-DM) 6.25-15 MG/5ML syrup Take 5 mLs by mouth 4 (four) times daily as needed for cough. 118 mL 0  . traMADol (ULTRAM) 50 MG tablet Take 1  tablet (50 mg total) by mouth 3 (three) times daily as needed for moderate pain. 40 tablet 0   No current facility-administered medications for this visit.    Allergies  Allergen Reactions  . Morphine And Related Other (See Comments)    Migraine headaches    Family History  Problem Relation Age of Onset  . Arthritis Mother   . Cancer Mother   . Hyperlipidemia Mother   . Diabetes Mother   . Hyperlipidemia Father   . Arthritis Maternal Grandmother   . Diabetes Maternal Grandmother   . Arthritis Maternal Grandfather   . Hyperlipidemia Maternal Grandfather   . Heart disease Maternal Grandfather   . Stroke Maternal Grandfather   . Cancer Paternal Grandmother   . Diabetes Paternal Grandfather     History   Social History  . Marital Status: Single    Spouse Name: N/A  . Number of Children: N/A  . Years of Education: N/A   Occupational History  . Not on file.   Social History Main Topics  . Smoking status: Former Smoker -- 2 years    Types: Cigarettes  . Smokeless tobacco: Never Used  . Alcohol Use: Yes     Comment: occasional  . Drug Use: No  . Sexual Activity: Not on file   Other Topics Concern  .  Not on file   Social History Narrative     Constitutional: Denies fever, malaise, fatigue, headache or abrupt weight changes.  HEENT: Denies eye pain, eye redness, ear pain, ringing in the ears, wax buildup, runny nose, nasal congestion, bloody nose, or sore throat. Respiratory: Denies difficulty breathing, shortness of breath, cough or sputum production.   Cardiovascular: Denies chest pain, chest tightness, palpitations or swelling in the hands or feet.  Gastrointestinal: Pt reports diarrhea. Denies abdominal pain, bloating, constipation, or blood in the stool.  GU: Denies urgency, frequency, pain with urination, burning sensation, blood in urine, odor or discharge. Musculoskeletal: Pt reports left breast pain. Denies decrease in range of motion, difficulty with gait,  muscle pain or joint pain and swelling.  Skin: Denies redness, rashes, lesions or ulcercations.  Neurological: Denies dizziness, difficulty with memory, difficulty with speech or problems with balance and coordination.  Psych: Denies anxiety, depression, SI/HI.  No other specific complaints in a complete review of systems (except as listed in HPI above).  Objective:   Physical Exam  BP 134/88 mmHg  Pulse 87  Temp(Src) 98.9 F (37.2 C) (Oral)  Ht 5' 4.25" (1.632 m)  Wt 251 lb (113.853 kg)  BMI 42.75 kg/m2  SpO2 98% Wt Readings from Last 3 Encounters:  02/05/15 251 lb (113.853 kg)  11/17/14 256 lb (116.121 kg)  08/07/14 257 lb (116.574 kg)    General: Appears her stated age, obese in NAD. Skin: Warm, dry and intact. No rashes, lesions or ulcerations noted. HEENT: Head: normal shape and size; Eyes: sclera white, no icterus, conjunctiva pink, PERRLA and EOMs intact; Ears: Tm's gray and intact, normal light reflex; Nose: mucosa pink and moist, septum midline; Throat/Mouth: Teeth present, mucosa pink and moist, no exudate, lesions or ulcerations noted.  Neck:  Neck supple, trachea midline. No masses, lumps or thyromegaly present.  Cardiovascular: Normal rate and rhythm. S1,S2 noted.  No murmur, rubs or gallops noted. No JVD or BLE edema. No carotid bruits noted. Pulmonary/Chest: Normal effort and positive vesicular breath sounds. No respiratory distress. No wheezes, rales or ronchi noted.  Abdomen: Soft and nontender. Normal bowel sounds, no bruits noted. No distention or masses noted. Liver, spleen and kidneys non palpable. Pelvic: Breasts with fibrocystic changes noted. No discrete masses noted in the left breast. No dimpling of the breast. No discharge noted from the nipple. Normal female pelvic anatomy. No discharge noted. No CMT noted. Adnexa non palpable. Musculoskeletal: Normal range of motion. Strength 5/5 BUE/BLE. No difficulty with gait.  Neurological: Alert and oriented. Cranial  nerves II-XII grossly intact. Coordination normal.  Psychiatric: Mood and affect normal. Behavior is normal. Judgment and thought content normal.     BMET    Component Value Date/Time   NA 143 08/07/2014 0944   K 4.6 08/07/2014 0944   K 3.8 06/30/2014 1546   CL 107 08/07/2014 0944   CO2 25 08/07/2014 0944   GLUCOSE 159* 08/07/2014 0944   BUN 16 08/07/2014 0944   CREATININE 0.8 08/07/2014 0944   CALCIUM 8.8 08/07/2014 0944    Lipid Panel     Component Value Date/Time   CHOL 192 08/07/2014 0944   TRIG 298.0* 08/07/2014 0944   HDL 33.00* 08/07/2014 0944   CHOLHDL 6 08/07/2014 0944   VLDL 59.6* 08/07/2014 0944   LDLCALC 115* 02/03/2014 0932    CBC    Component Value Date/Time   WBC 10.4 10/29/2013 1009   RBC 4.73 10/29/2013 1009   HGB 13.8 10/29/2013 1009  HCT 40.9 10/29/2013 1009   PLT 284.0 10/29/2013 1009   MCV 86.6 10/29/2013 1009   MCHC 33.7 10/29/2013 1009   RDW 13.2 10/29/2013 1009    Hgb A1C Lab Results  Component Value Date   HGBA1C 6.7* 08/07/2014         Assessment & Plan:   Preventative Health Maintenance:  Encouraged her to continue to work on diet and exercise Continue eye exam yearly, she has an appt next week Encouraged her to see a dentist at least annually Will check CBC, CMET and Lipid profile today Immunizations UTD Pap smear today She is due for a screening mammogram but because she is having left breast pain, will order diagnostic bilateral mammogram and left breast ultrasound Will refer to GI for screening colonoscopy  RTC in 6 months to follow up chronic conditions

## 2015-02-05 NOTE — Addendum Note (Signed)
Addended by: Lurlean Nanny on: 02/05/2015 03:08 PM   Modules accepted: Orders

## 2015-02-05 NOTE — Patient Instructions (Signed)

## 2015-02-06 ENCOUNTER — Other Ambulatory Visit: Payer: Self-pay | Admitting: Internal Medicine

## 2015-02-06 LAB — LDL CHOLESTEROL, DIRECT: Direct LDL: 114 mg/dL

## 2015-02-06 MED ORDER — SIMVASTATIN 10 MG PO TABS: 10.0000 mg | ORAL_TABLET | Freq: Every day | ORAL | Status: DC

## 2015-02-07 ENCOUNTER — Other Ambulatory Visit: Payer: Self-pay | Admitting: Internal Medicine

## 2015-02-10 LAB — CYTOLOGY - PAP

## 2015-02-25 ENCOUNTER — Other Ambulatory Visit: Payer: Self-pay | Admitting: Internal Medicine

## 2015-02-26 ENCOUNTER — Encounter: Payer: Self-pay | Admitting: Internal Medicine

## 2015-02-26 MED ORDER — LISINOPRIL-HYDROCHLOROTHIAZIDE 10-12.5 MG PO TABS: 1.0000 | ORAL_TABLET | Freq: Every day | ORAL | Status: DC

## 2015-02-27 ENCOUNTER — Other Ambulatory Visit: Payer: Self-pay | Admitting: Internal Medicine

## 2015-03-09 ENCOUNTER — Ambulatory Visit
Admission: RE | Admit: 2015-03-09 | Discharge: 2015-03-09 | Disposition: A | Discharge status: Home / Self Care | Payer: BLUE CROSS/BLUE SHIELD | Source: Ambulatory Visit | Attending: Internal Medicine | Admitting: Internal Medicine

## 2015-03-09 DIAGNOSIS — N644 Mastodynia: Secondary | ICD-10-CM

## 2015-03-09 DIAGNOSIS — Z1239 Encounter for other screening for malignant neoplasm of breast: Secondary | ICD-10-CM

## 2015-03-30 ENCOUNTER — Ambulatory Visit: Payer: BLUE CROSS/BLUE SHIELD | Admitting: Anesthesiology

## 2015-03-30 ENCOUNTER — Encounter: Payer: Self-pay | Admitting: *Deleted

## 2015-03-30 ENCOUNTER — Ambulatory Visit
Admission: RE | Admit: 2015-03-30 | Discharge: 2015-03-30 | Disposition: A | Discharge status: Home / Self Care | Payer: BLUE CROSS/BLUE SHIELD | Source: Ambulatory Visit | Attending: Gastroenterology | Admitting: Gastroenterology

## 2015-03-30 ENCOUNTER — Encounter
Admission: RE | Disposition: A | Discharge status: Home / Self Care | Payer: Self-pay | Source: Ambulatory Visit | Attending: Gastroenterology

## 2015-03-30 DIAGNOSIS — Z800 Family history of malignant neoplasm of digestive organs: Secondary | ICD-10-CM | POA: Insufficient documentation

## 2015-03-30 DIAGNOSIS — D123 Benign neoplasm of transverse colon: Secondary | ICD-10-CM | POA: Insufficient documentation

## 2015-03-30 DIAGNOSIS — F329 Major depressive disorder, single episode, unspecified: Secondary | ICD-10-CM | POA: Insufficient documentation

## 2015-03-30 DIAGNOSIS — K5730 Diverticulosis of large intestine without perforation or abscess without bleeding: Secondary | ICD-10-CM | POA: Insufficient documentation

## 2015-03-30 DIAGNOSIS — Z885 Allergy status to narcotic agent status: Secondary | ICD-10-CM | POA: Diagnosis not present

## 2015-03-30 DIAGNOSIS — I10 Essential (primary) hypertension: Secondary | ICD-10-CM | POA: Diagnosis not present

## 2015-03-30 DIAGNOSIS — Z7982 Long term (current) use of aspirin: Secondary | ICD-10-CM | POA: Insufficient documentation

## 2015-03-30 DIAGNOSIS — E785 Hyperlipidemia, unspecified: Secondary | ICD-10-CM | POA: Diagnosis not present

## 2015-03-30 DIAGNOSIS — Z79899 Other long term (current) drug therapy: Secondary | ICD-10-CM | POA: Insufficient documentation

## 2015-03-30 DIAGNOSIS — E119 Type 2 diabetes mellitus without complications: Secondary | ICD-10-CM | POA: Diagnosis not present

## 2015-03-30 DIAGNOSIS — Z87442 Personal history of urinary calculi: Secondary | ICD-10-CM | POA: Insufficient documentation

## 2015-03-30 DIAGNOSIS — Z1211 Encounter for screening for malignant neoplasm of colon: Secondary | ICD-10-CM | POA: Diagnosis not present

## 2015-03-30 HISTORY — PX: COLONOSCOPY: SHX5424

## 2015-03-30 LAB — GLUCOSE, CAPILLARY: Glucose-Capillary: 127 mg/dL — ABNORMAL HIGH (ref 65–99)

## 2015-03-30 SURGERY — COLONOSCOPY
Anesthesia: General

## 2015-03-30 MED ORDER — FENTANYL CITRATE (PF) 100 MCG/2ML IJ SOLN
INTRAMUSCULAR | Status: DC | PRN
  Administered 2015-03-30 (×2): 50 ug via INTRAVENOUS

## 2015-03-30 MED ORDER — LIDOCAINE HCL (CARDIAC) 20 MG/ML IV SOLN
INTRAVENOUS | Status: DC | PRN
  Administered 2015-03-30: 30 mg via INTRAVENOUS

## 2015-03-30 MED ORDER — PROPOFOL 10 MG/ML IV BOLUS
INTRAVENOUS | Status: DC | PRN
  Administered 2015-03-30: 50 mg via INTRAVENOUS

## 2015-03-30 MED ORDER — PROPOFOL INFUSION 10 MG/ML OPTIME
INTRAVENOUS | Status: DC | PRN
  Administered 2015-03-30: 160 ug/kg/min via INTRAVENOUS

## 2015-03-30 MED ORDER — SODIUM CHLORIDE 0.9 % IV SOLN
INTRAVENOUS | Status: DC
  Administered 2015-03-30 (×2): via INTRAVENOUS

## 2015-03-30 MED ORDER — EPHEDRINE SULFATE 50 MG/ML IJ SOLN
INTRAMUSCULAR | Status: DC | PRN
  Administered 2015-03-30: 5 mg via INTRAVENOUS

## 2015-03-30 MED ORDER — MIDAZOLAM HCL 5 MG/5ML IJ SOLN
INTRAMUSCULAR | Status: DC | PRN
  Administered 2015-03-30 (×2): 1 mg via INTRAVENOUS

## 2015-03-30 MED ORDER — SODIUM CHLORIDE 0.9 % IV SOLN
INTRAVENOUS | Status: DC
Start: 2015-03-30 — End: 2015-03-30

## 2015-03-30 NOTE — Anesthesia Preprocedure Evaluation (Signed)
Anesthesia Evaluation  Patient identified by MRN, date of birth, ID band Patient awake    Reviewed: Allergy & Precautions, H&P , NPO status , Patient's Chart, lab work & pertinent test results, reviewed documented beta blocker date and time   Airway Mallampati: II  TM Distance: >3 FB Neck ROM: full    Dental no notable dental hx.    Pulmonary neg pulmonary ROS, former smoker,  breath sounds clear to auscultation  Pulmonary exam normal       Cardiovascular Exercise Tolerance: Good hypertension, negative cardio ROS  Rhythm:regular Rate:Normal     Neuro/Psych  Headaches, negative neurological ROS  negative psych ROS   GI/Hepatic negative GI ROS, Neg liver ROS,   Endo/Other  negative endocrine ROSdiabetes  Renal/GU Renal diseasenegative Renal ROS  negative genitourinary   Musculoskeletal   Abdominal   Peds  Hematology negative hematology ROS (+)   Anesthesia Other Findings   Reproductive/Obstetrics negative OB ROS                             Anesthesia Physical Anesthesia Plan  ASA: II  Anesthesia Plan: General   Post-op Pain Management:    Induction:   Airway Management Planned:   Additional Equipment:   Intra-op Plan:   Post-operative Plan:   Informed Consent: I have reviewed the patients History and Physical, chart, labs and discussed the procedure including the risks, benefits and alternatives for the proposed anesthesia with the patient or authorized representative who has indicated his/her understanding and acceptance.   Dental Advisory Given  Plan Discussed with: CRNA  Anesthesia Plan Comments:         Anesthesia Quick Evaluation

## 2015-03-30 NOTE — H&P (Signed)
Outpatient short stay form Pre-procedure 03/30/2015 1:51 PM Lollie Sails MD  Primary Physician: Dr. Webb Silversmith  Reason for visit:  Colonoscopy  History of present illness:  Patient is a 54 year old female presenting today for colonoscopy. This is her first colonoscopy. There is a family history of colon cancer and a primary relative. She tolerated her prep well. She takes no aspirin currently or anticoagulates.    Current facility-administered medications:  .  0.9 %  sodium chloride infusion, , Intravenous, Continuous, Lollie Sails, MD, Last Rate: 50 mL/hr at 03/30/15 1341 .  0.9 %  sodium chloride infusion, , Intravenous, Continuous, Lollie Sails, MD  Prescriptions prior to admission  Medication Sig Dispense Refill Last Dose  . cyclobenzaprine (FLEXERIL) 10 MG tablet Take 0.5-1 tablets (5-10 mg total) by mouth at bedtime as needed for muscle spasms. 15 tablet 0 Taking  . ibuprofen (ADVIL,MOTRIN) 400 MG tablet Take 400 mg by mouth daily as needed.   Taking  . lisinopril-hydrochlorothiazide (PRINZIDE,ZESTORETIC) 10-12.5 MG per tablet TAKE 1 TABLET BY MOUTH DAILY 30 tablet 5   . Loratadine (CLARITIN) 10 MG CAPS Take 1 capsule by mouth daily.   Taking  . metFORMIN (GLUCOPHAGE) 500 MG tablet TAKE 1 TABLET (500 MG TOTAL) BY MOUTH 2 (TWO) TIMES DAILY WITH A MEAL. 60 tablet 5   . Nepafenac (ILEVRO) 0.3 % SUSP Apply 1 drop to eye.   Taking  . Omega-3 Fatty Acids (FISH OIL PO) Take 1 capsule by mouth daily.   Taking  . promethazine-dextromethorphan (PROMETHAZINE-DM) 6.25-15 MG/5ML syrup Take 5 mLs by mouth 4 (four) times daily as needed for cough. 118 mL 0 Taking  . simvastatin (ZOCOR) 10 MG tablet Take 1 tablet (10 mg total) by mouth at bedtime. 30 tablet 2   . traMADol (ULTRAM) 50 MG tablet Take 1 tablet (50 mg total) by mouth 3 (three) times daily as needed for moderate pain. 40 tablet 0 Taking     Allergies  Allergen Reactions  . Morphine And Related Other (See Comments)     Migraine headaches     Past Medical History  Diagnosis Date  . Chicken pox   . Depression   . Frequent headaches   . Allergy   . Hypertension   . Kidney stones   . Hyperlipidemia   . Diabetes mellitus without complication   . Syncope and collapse     Review of systems:      Physical Exam    Heart and lungs: Regular rate and rhythm without rub or gallop lungs are bilaterally clear    HEENT: Normocephalic atraumatic eyes are anicteric    Other:     Pertinant exam for procedure: Soft nontender nondistended bowel sounds positive normoactive    Planned proceedures: Colonoscopy and indicated procedures I have discussed the risks benefits and complications of procedures to include not limited to bleeding, infection, perforation and the risk of sedation and the patient wishes to proceed.    Lollie Sails, MD Gastroenterology 03/30/2015  1:51 PM

## 2015-03-30 NOTE — Transfer of Care (Signed)
Immediate Anesthesia Transfer of Care Note  Patient: Tasha Leonard  Procedure(s) Performed: Procedure(s): COLONOSCOPY WITH PROPOL (N/A)  Patient Location: PACU, Short Stay and Endoscopy Unit  Anesthesia Type:General  Level of Consciousness: awake and sedated  Airway & Oxygen Therapy: Patient Spontanous Breathing and Patient connected to nasal cannula oxygen  Post-op Assessment: Report given to RN and Post -op Vital signs reviewed and stable  Post vital signs: Reviewed and stable  Last Vitals:  Filed Vitals:   03/30/15 1326  BP: 156/94  Pulse: 81  Temp: 37.7 C  Resp: 18    Complications: No apparent anesthesia complications

## 2015-03-30 NOTE — Op Note (Signed)
[facility] Patient Name: Tasha Leonard Procedure Date: 03/30/2015 2:20 PM MRN: [id number] Account #: [id number] Date of Birth: 1961-09-01 Admit Type: Outpatient Age: 54 Room: [facility] ENDO ROOM 2 Gender: Female Note Status: Finalized Procedure:         Colonoscopy Indications:       Family history of colon cancer in a first-degree relative Providers:         Lollie Sails, MD Medicines:         Monitored Anesthesia Care Complications:     No immediate complications. Procedure:         Pre-Anesthesia Assessment:                    - ASA Grade Assessment: II - A patient with mild systemic                     disease.                    After obtaining informed consent, the colonoscope was                     passed under direct vision. Throughout the procedure, the                     patient's blood pressure, pulse, and oxygen saturations                     were monitored continuously. The Olympus PCF-160AL                     colonoscope (S#. [id number]) was introduced through the anus                     and advanced to the the cecum, identified by appendiceal                     orifice and ileocecal valve. The colonoscopy was performed                     without difficulty. The patient tolerated the procedure                     well. The quality of the bowel preparation was good. Findings:      A 2 mm polyp was found in the transverse colon. The polyp was flat. The       polyp was removed with a cold biopsy forceps. Resection and retrieval       were complete.      Multiple medium-mouthed diverticula were found in the sigmoid colon and       in the descending colon.      The exam was otherwise without abnormality.      The retroflexed view of the distal rectum and anal verge was normal and       showed no anal or rectal abnormalities.      The digital rectal exam was normal. Impression:        - One 2 mm polyp in the transverse colon.  Resected and                     retrieved.                    - Diverticulosis in the sigmoid colon and in the  descending colon.                    - The examination was otherwise normal.                    - The distal rectum and anal verge are normal on                     retroflexion view. Recommendation:    - Await pathology results.                    - Telephone GI clinic for pathology results in 1 week. Procedure Code(s): --- Professional ---                    [phone], Colonoscopy, flexible; with biopsy, single or                     multiple Diagnosis Code(s): --- Professional ---                    211.3, Benign neoplasm of colon                    V16.0, Family history of malignant neoplasm of                     gastrointestinal tract                    562.10, Diverticulosis of colon (without mention of                     hemorrhage) CPT copyright 2014 American Medical Association. All rights reserved. The codes documented in this report are preliminary and upon coder review may  be revised to meet current compliance requirements. Lollie Sails, MD 03/30/2015 2:27:26 PM This report has been signed electronically. Number of Addenda: 0 Note Initiated On: 03/30/2015 2:20 PM      [facility]

## 2015-03-31 NOTE — Anesthesia Postprocedure Evaluation (Signed)
  Anesthesia Post-op Note  Patient: Tasha Leonard  Procedure(s) Performed: Procedure(s): COLONOSCOPY WITH PROPOL (N/A)  Anesthesia type:General  Patient location: PACU  Post pain: Pain level controlled  Post assessment: Post-op Vital signs reviewed, Patient's Cardiovascular Status Stable, Respiratory Function Stable, Patent Airway and No signs of Nausea or vomiting  Post vital signs: Reviewed and stable  Last Vitals:  Filed Vitals:   03/30/15 1520  BP: 110/65  Pulse: 66  Temp:   Resp: 14    Level of consciousness: awake, alert  and patient cooperative  Complications: No apparent anesthesia complications

## 2015-04-01 ENCOUNTER — Encounter: Payer: Self-pay | Admitting: Gastroenterology

## 2015-04-01 LAB — SURGICAL PATHOLOGY

## 2015-04-13 ENCOUNTER — Other Ambulatory Visit: Payer: Self-pay | Admitting: Internal Medicine

## 2015-04-14 MED ORDER — CYCLOBENZAPRINE HCL 10 MG PO TABS: 5.0000 mg | ORAL_TABLET | Freq: Every evening | ORAL | Status: DC | PRN

## 2015-04-14 MED ORDER — TRAMADOL HCL 50 MG PO TABS: 50.0000 mg | ORAL_TABLET | Freq: Three times a day (TID) | ORAL | Status: DC | PRN

## 2015-04-14 NOTE — Telephone Encounter (Signed)
Last filled 01/2015--please advise

## 2015-04-14 NOTE — Telephone Encounter (Signed)
Ok to phone in Tramadol Flexeril sent electronically

## 2015-04-14 NOTE — Telephone Encounter (Signed)
Medication phoned to pharmacy.  

## 2015-04-30 ENCOUNTER — Other Ambulatory Visit: Payer: Self-pay | Admitting: Internal Medicine

## 2015-06-18 ENCOUNTER — Telehealth: Payer: Self-pay

## 2015-06-18 ENCOUNTER — Encounter: Payer: Self-pay | Admitting: Internal Medicine

## 2015-06-18 ENCOUNTER — Ambulatory Visit (INDEPENDENT_AMBULATORY_CARE_PROVIDER_SITE_OTHER): Payer: BLUE CROSS/BLUE SHIELD | Admitting: Internal Medicine

## 2015-06-18 ENCOUNTER — Other Ambulatory Visit: Payer: Self-pay | Admitting: Internal Medicine

## 2015-06-18 VITALS — BP 136/84 | HR 73 | Temp 98.3°F | Wt 244.0 lb

## 2015-06-18 DIAGNOSIS — R1012 Left upper quadrant pain: Secondary | ICD-10-CM

## 2015-06-18 DIAGNOSIS — R112 Nausea with vomiting, unspecified: Secondary | ICD-10-CM

## 2015-06-18 DIAGNOSIS — R748 Abnormal levels of other serum enzymes: Secondary | ICD-10-CM

## 2015-06-18 DIAGNOSIS — R5383 Other fatigue: Secondary | ICD-10-CM

## 2015-06-18 DIAGNOSIS — R1110 Vomiting, unspecified: Secondary | ICD-10-CM

## 2015-06-18 LAB — COMPREHENSIVE METABOLIC PANEL
ALK PHOS: 156 U/L — AB (ref 39–117)
ALT: 38 U/L — ABNORMAL HIGH (ref 0–35)
AST: 33 U/L (ref 0–37)
Albumin: 3.6 g/dL (ref 3.5–5.2)
BILIRUBIN TOTAL: 0.5 mg/dL (ref 0.2–1.2)
BUN: 17 mg/dL (ref 6–23)
CALCIUM: 9.5 mg/dL (ref 8.4–10.5)
CO2: 27 mEq/L (ref 19–32)
Chloride: 98 mEq/L (ref 96–112)
Creatinine, Ser: 0.99 mg/dL (ref 0.40–1.20)
GFR: 62.34 mL/min (ref >60.00)
Glucose, Bld: 182 mg/dL — ABNORMAL HIGH (ref 70–99)
POTASSIUM: 3.9 meq/L (ref 3.5–5.1)
Sodium: 134 mEq/L — ABNORMAL LOW (ref 135–145)
TOTAL PROTEIN: 7.7 g/dL (ref 6.0–8.3)

## 2015-06-18 LAB — CBC
HEMATOCRIT: 36.3 % (ref 36.0–46.0)
HEMOGLOBIN: 12.2 g/dL (ref 12.0–15.0)
MCHC: 33.6 g/dL (ref 30.0–36.0)
MCV: 86.9 fl (ref 78.0–100.0)
PLATELETS: 335 10*3/uL (ref 150.0–400.0)
RBC: 4.18 Mil/uL (ref 3.87–5.11)
RDW: 13.7 % (ref 11.5–15.5)
WBC: 14.5 10*3/uL — ABNORMAL HIGH (ref 4.0–10.5)

## 2015-06-18 LAB — LIPASE: LIPASE: 59 U/L (ref 11.0–59.0)

## 2015-06-18 LAB — AMYLASE: AMYLASE: 26 U/L — AB (ref 27–131)

## 2015-06-18 LAB — MONONUCLEOSIS SCREEN: MONO SCREEN: NEGATIVE

## 2015-06-18 MED ORDER — PROMETHAZINE HCL 12.5 MG PO TABS: 12.5000 mg | ORAL_TABLET | Freq: Four times a day (QID) | ORAL | Status: DC | PRN

## 2015-06-18 NOTE — Patient Instructions (Signed)

## 2015-06-18 NOTE — Progress Notes (Signed)
Subjective:    Patient ID: Tasha Leonard, female    DOB: 1961/03/01, 54 y.o.   MRN: [id number]  HPI  Pt presents to the clinic today with c/o fatigue, nausea and loss of appetite. This started 5 days ago. Symptoms started shortly after eating at [employer] for breakfast. She did vomit 3-4 times on Saturday. She has had chills and sweats intermittently during this time. She denies abdominal pain, diarrhea or blood in her stool. She does feel a little constipated so she took a Dulcolax but did not feel it was very effective. Her last BM was yesterday morning which was normal. She tells me that she has not taken her diabetes medication while she was feeling sick because she did not want it to drop her blood sugars. She has not been testing her sugars. She denies runny nose, ear pain, cough, shortness of breath or rash. She has not tried anything other than the Dulcolax. She did go the hospital on Friday to visit some friends. She has not had contacts with similar symptoms.  Review of Systems      Past Medical History  Diagnosis Date  . Chicken pox   . Depression   . Frequent headaches   . Allergy   . Hypertension   . Kidney stones   . Hyperlipidemia   . Diabetes mellitus without complication   . Syncope and collapse     Current Outpatient Prescriptions  Medication Sig Dispense Refill  . cyclobenzaprine (FLEXERIL) 10 MG tablet Take 0.5-1 tablets (5-10 mg total) by mouth at bedtime as needed for muscle spasms. 15 tablet 0  . ibuprofen (ADVIL,MOTRIN) 400 MG tablet Take 400 mg by mouth daily as needed.    Marland Kitchen lisinopril-hydrochlorothiazide (PRINZIDE,ZESTORETIC) 10-12.5 MG per tablet TAKE 1 TABLET BY MOUTH DAILY 30 tablet 5  . Loratadine (CLARITIN) 10 MG CAPS Take 1 capsule by mouth daily.    . metFORMIN (GLUCOPHAGE) 500 MG tablet TAKE 1 TABLET (500 MG TOTAL) BY MOUTH 2 (TWO) TIMES DAILY WITH A MEAL. 60 tablet 5  . Nepafenac (ILEVRO) 0.3 % SUSP Apply 1 drop to eye.    . Omega-3 Fatty  Acids (FISH OIL PO) Take 1 capsule by mouth daily.    . simvastatin (ZOCOR) 10 MG tablet TAKE 1 TABLET BY MOUTH DAILY AT BEDTIME 30 tablet 3  . traMADol (ULTRAM) 50 MG tablet Take 1 tablet (50 mg total) by mouth 3 (three) times daily as needed for moderate pain. 40 tablet 0   No current facility-administered medications for this visit.    Allergies  Allergen Reactions  . Morphine And Related Other (See Comments)    Migraine headaches    Family History  Problem Relation Age of Onset  . Arthritis Mother   . Cancer Mother   . Hyperlipidemia Mother   . Diabetes Mother   . Hyperlipidemia Father   . Arthritis Maternal Grandmother   . Diabetes Maternal Grandmother   . Arthritis Maternal Grandfather   . Hyperlipidemia Maternal Grandfather   . Heart disease Maternal Grandfather   . Stroke Maternal Grandfather   . Cancer Paternal Grandmother   . Diabetes Paternal Grandfather     Social History   Social History  . Marital Status: Single    Spouse Name: N/A  . Number of Children: N/A  . Years of Education: N/A   Occupational History  . Not on file.   Social History Main Topics  . Smoking status: Former Smoker -- 2 years  Types: Cigarettes  . Smokeless tobacco: Never Used  . Alcohol Use: Yes     Comment: occasional  . Drug Use: No  . Sexual Activity: Not on file   Other Topics Concern  . Not on file   Social History Narrative     Constitutional: Pt reports fatigue. Denies fever, malaise, headache or abrupt weight changes.  HEENT: Denies eye pain, eye redness, ear pain, ringing in the ears, wax buildup, runny nose, nasal congestion, bloody nose, or sore throat. Respiratory: Denies difficulty breathing, shortness of breath, cough or sputum production.   Cardiovascular: Denies chest pain, chest tightness, palpitations or swelling in the hands or feet.  Gastrointestinal: Pt reports nausea. Denies abdominal pain, bloating, constipation, diarrhea or blood in the stool.    GU: Denies urgency, frequency, pain with urination, burning sensation, blood in urine, odor or discharge. Musculoskeletal: Denies decrease in range of motion, difficulty with gait, muscle pain or joint pain and swelling.  Skin: Denies redness, rashes, lesions or ulcercations.  Neurological: Denies dizziness, difficulty with memory, difficulty with speech or problems with balance and coordination.    No other specific complaints in a complete review of systems (except as listed in HPI above).  Objective:   Physical Exam  BP 136/84 mmHg  Pulse 73  Temp(Src) 98.3 F (36.8 C) (Oral)  Wt 244 lb (110.678 kg)  SpO2 98% Wt Readings from Last 3 Encounters:  06/18/15 244 lb (110.678 kg)  03/30/15 250 lb (113.399 kg)  02/05/15 251 lb (113.853 kg)    General: Appears her stated age, obese in NAD. Skin: Warm, dry and intact. No rashes, lesions or ulcerations noted. HEENT: Head: normal shape and size; Eyes: sclera white, no icterus, conjunctiva pink, PERRLA and EOMs intact; Ears: Tm's gray and intact, normal light reflex;Throat/Mouth: Teeth present, mucosa pink and moist, no exudate, lesions or ulcerations noted.  Neck:  No adenopathy noted. Cardiovascular: Normal rate and rhythm. S1,S2 noted.  No murmur, rubs or gallops noted.  Pulmonary/Chest: Normal effort and positive vesicular breath sounds. No respiratory distress. No wheezes, rales or ronchi noted.  Abdomen: Soft and tender in the LUQ. Normal bowel sounds, no bruits noted. No distention or masses noted.  Neurological: Alert and oriented.   BMET    Component Value Date/Time   NA 137 02/05/2015 1512   K 3.8 02/05/2015 1512   K 3.8 06/30/2014 1546   CL 103 02/05/2015 1512   CO2 27 02/05/2015 1512   GLUCOSE 147* 02/05/2015 1512   BUN 11 02/05/2015 1512   CREATININE 0.74 02/05/2015 1512   CALCIUM 9.3 02/05/2015 1512    Lipid Panel     Component Value Date/Time   CHOL 192 02/05/2015 1512   TRIG 309.0* 02/05/2015 1512   HDL  41.60 02/05/2015 1512   CHOLHDL 5 02/05/2015 1512   VLDL 61.8* 02/05/2015 1512   LDLCALC 115* 02/03/2014 0932    CBC    Component Value Date/Time   WBC 10.8* 02/05/2015 1512   RBC 4.36 02/05/2015 1512   HGB 12.9 02/05/2015 1512   HCT 37.0 02/05/2015 1512   PLT 309.0 02/05/2015 1512   MCV 84.8 02/05/2015 1512   MCHC 34.8 02/05/2015 1512   RDW 13.6 02/05/2015 1512    Hgb A1C Lab Results  Component Value Date   HGBA1C 6.7* 08/07/2014         Assessment & Plan:  LUQ pain, nausea, loss of appetite and fatigue:  ? Viral Get some rest and drink plenty of fluids Will check  CBC, CMET, mono spot, amylase and lipase eRx for Phenergan 12.5 mg for nausea  Will follow up after labs, RTC as needed or if symptoms persist or worsen

## 2015-06-18 NOTE — Telephone Encounter (Signed)
Per Tasha Leonard, she gave her results while scheduling [place]

## 2015-06-18 NOTE — Telephone Encounter (Signed)
Pt left v/m requesting cb when lab results available.

## 2015-06-18 NOTE — Progress Notes (Signed)
Pre visit review using our clinic review tool, if applicable. No additional management support is needed unless otherwise documented below in the visit note. 

## 2015-06-19 ENCOUNTER — Telehealth: Payer: Self-pay

## 2015-06-19 ENCOUNTER — Ambulatory Visit
Admission: RE | Admit: 2015-06-19 | Discharge: 2015-06-19 | Disposition: A | Discharge status: Home / Self Care | Payer: BLUE CROSS/BLUE SHIELD | Source: Ambulatory Visit | Attending: Internal Medicine | Admitting: Internal Medicine

## 2015-06-19 DIAGNOSIS — R1110 Vomiting, unspecified: Secondary | ICD-10-CM | POA: Diagnosis not present

## 2015-06-19 DIAGNOSIS — R748 Abnormal levels of other serum enzymes: Secondary | ICD-10-CM

## 2015-06-19 NOTE — Telephone Encounter (Signed)
noted 

## 2015-06-19 NOTE — Telephone Encounter (Signed)
Amber [facility] [place] called report for [place] of abdomen; Webb Silversmith NP not in office until this afternoon; advised Amber pt could go home and would receive cb from Avie Echevaria NP office. Spoke with Allie Bossier NP and she agreed OK for pt to go home and await cb. Amber voiced understanding. Pt also wanted to know if pt could eat;  pt was concerned if gallstones pt might need surgery; since no gallstones seen advised pt can eat.

## 2015-06-22 ENCOUNTER — Other Ambulatory Visit: Payer: Self-pay | Admitting: Internal Medicine

## 2015-06-22 DIAGNOSIS — R748 Abnormal levels of other serum enzymes: Secondary | ICD-10-CM

## 2015-07-01 ENCOUNTER — Telehealth: Payer: Self-pay | Admitting: Internal Medicine

## 2015-07-01 NOTE — Telephone Encounter (Signed)
Pt called with update on condition Still some stomach pain and diarrhea, but not as exhausted as she was. Getting better each day  cb number is [phone]

## 2015-07-01 NOTE — Telephone Encounter (Signed)
No follow up needed

## 2015-07-01 NOTE — Telephone Encounter (Signed)
You wanted pt to give you an update in a week--please advise if you want pt to f/u with you

## 2015-07-03 ENCOUNTER — Other Ambulatory Visit (INDEPENDENT_AMBULATORY_CARE_PROVIDER_SITE_OTHER): Payer: BLUE CROSS/BLUE SHIELD

## 2015-07-03 DIAGNOSIS — R748 Abnormal levels of other serum enzymes: Secondary | ICD-10-CM | POA: Diagnosis not present

## 2015-07-03 LAB — COMPREHENSIVE METABOLIC PANEL
ALT: 8 U/L (ref 0–35)
AST: 12 U/L (ref 0–37)
Albumin: 4 g/dL (ref 3.5–5.2)
Alkaline Phosphatase: 74 U/L (ref 39–117)
BUN: 20 mg/dL (ref 6–23)
CO2: 28 meq/L (ref 19–32)
CREATININE: 0.88 mg/dL (ref 0.40–1.20)
Calcium: 9.5 mg/dL (ref 8.4–10.5)
Chloride: 103 mEq/L (ref 96–112)
GFR: 71.41 mL/min (ref >60.00)
Glucose, Bld: 202 mg/dL — ABNORMAL HIGH (ref 70–99)
Potassium: 4.6 mEq/L (ref 3.5–5.1)
SODIUM: 140 meq/L (ref 135–145)
Total Bilirubin: 0.3 mg/dL (ref 0.2–1.2)
Total Protein: 7.7 g/dL (ref 6.0–8.3)

## 2015-07-03 LAB — HEMOGLOBIN A1C: Hgb A1c MFr Bld: 7.6 % — ABNORMAL HIGH (ref 4.6–6.5)

## 2015-07-06 ENCOUNTER — Encounter: Payer: Self-pay | Admitting: Internal Medicine

## 2015-07-13 MED ORDER — METFORMIN HCL 1000 MG PO TABS: 1000.0000 mg | ORAL_TABLET | Freq: Two times a day (BID) | ORAL | Status: DC

## 2015-07-14 NOTE — Telephone Encounter (Signed)
When did you want pt to f/u? 3 mth repeat A1C or 3 month f/u OV---pt not due for CPE until 02/2016--please advise

## 2015-08-20 ENCOUNTER — Encounter: Payer: Self-pay | Admitting: Internal Medicine

## 2015-08-21 MED ORDER — ONETOUCH LANCETS MISC
1.0000 | Freq: Three times a day (TID) | Status: DC
Start: 2015-08-21 — End: 2016-12-27

## 2015-08-21 MED ORDER — ONETOUCH ULTRA SYSTEM W/DEVICE KIT: 1.0000 | PACK | Freq: Once | Status: AC

## 2015-08-21 MED ORDER — GLUCOSE BLOOD VI STRP: ORAL_STRIP | Status: DC

## 2015-08-24 ENCOUNTER — Telehealth: Payer: Self-pay | Admitting: Internal Medicine

## 2015-08-24 MED ORDER — GLUCOSE BLOOD VI STRP: 1.0000 | ORAL_STRIP | Freq: Three times a day (TID) | Status: AC

## 2015-08-24 NOTE — Addendum Note (Signed)
Addended by: Lurlean Nanny on: 08/24/2015 12:07 PM   Modules accepted: Orders

## 2015-08-24 NOTE — Telephone Encounter (Signed)
Denton Ar from [place] called Pt came in on Friday and Sat, to the pharmacy, trying to pick up a rx for her test strips.  She said the directions say to use as needed.  Her insurance will not pay for this.  They need more specific instructions.  Please call [facility]

## 2015-08-24 NOTE — Telephone Encounter (Signed)
She should be testing TID

## 2015-08-26 NOTE — Telephone Encounter (Signed)
Rx changed and sent to pharmacy.  °

## 2015-09-10 ENCOUNTER — Other Ambulatory Visit: Payer: Self-pay | Admitting: Internal Medicine

## 2015-10-12 ENCOUNTER — Ambulatory Visit: Payer: Self-pay | Admitting: Internal Medicine

## 2015-11-19 ENCOUNTER — Other Ambulatory Visit: Payer: Self-pay | Admitting: Internal Medicine

## 2015-12-02 LAB — HM DIABETES EYE EXAM

## 2016-01-21 ENCOUNTER — Encounter: Payer: Self-pay | Admitting: Family Medicine

## 2016-01-21 ENCOUNTER — Ambulatory Visit (INDEPENDENT_AMBULATORY_CARE_PROVIDER_SITE_OTHER): Payer: BLUE CROSS/BLUE SHIELD | Admitting: Family Medicine

## 2016-01-21 VITALS — BP 126/78 | HR 67 | Temp 97.7°F | Wt 251.5 lb

## 2016-01-21 DIAGNOSIS — J0110 Acute frontal sinusitis, unspecified: Secondary | ICD-10-CM | POA: Diagnosis not present

## 2016-01-21 MED ORDER — AMOXICILLIN-POT CLAVULANATE 875-125 MG PO TABS: 1.0000 | ORAL_TABLET | Freq: Two times a day (BID) | ORAL | Status: AC

## 2016-01-21 NOTE — Patient Instructions (Signed)
Nice to meet you. Please continue using your claritin and nasal spray. Take Augmentin as directed- 1 tablet twice daily for 10 days.

## 2016-01-21 NOTE — Progress Notes (Signed)
SUBJECTIVE:  Tasha Leonard is a 55 y.o. female who complains of coryza, congestion, dry cough and bilateral sinus pain for 8 days. She denies a history of anorexia and chest pain and denies a history of asthma. Patient denies smoke cigarettes.   Current Outpatient Prescriptions on File Prior to Visit  Medication Sig Dispense Refill  . Blood Glucose Monitoring Suppl (ONE TOUCH ULTRA SYSTEM KIT) W/DEVICE KIT 1 kit by Does not apply route once. 1 each 0  . cyclobenzaprine (FLEXERIL) 10 MG tablet TAKE 1/2 TO 1 TABLET BY MOUTH AT BEDTIMEAS NEEDED FOR MUSCLE SPASMS 15 tablet 1  . glucose blood (ONE TOUCH TEST STRIPS) test strip 1 each by Other route 3 (three) times daily. 200 each 3  . ibuprofen (ADVIL,MOTRIN) 400 MG tablet Take 400 mg by mouth daily as needed.    Marland Kitchen lisinopril-hydrochlorothiazide (PRINZIDE,ZESTORETIC) 10-12.5 MG tablet Take 1 tablet by mouth daily. SCHEDULE PHYSICAL FOR May 2017 30 tablet 2  . Loratadine (CLARITIN) 10 MG CAPS Take 1 capsule by mouth daily.    . metFORMIN (GLUCOPHAGE) 1000 MG tablet Take 1 tablet (1,000 mg total) by mouth 2 (two) times daily with a meal. SCHEDULE PHYSICAL FOR May 2017 60 tablet 2  . Nepafenac (ILEVRO) 0.3 % SUSP Apply 1 drop to eye.    . Omega-3 Fatty Acids (FISH OIL PO) Take 1 capsule by mouth daily.    . ONE TOUCH LANCETS MISC 1 each by Does not apply route 3 (three) times daily. 200 each 3  . promethazine (PHENERGAN) 12.5 MG tablet Take 1 tablet (12.5 mg total) by mouth every 6 (six) hours as needed for nausea or vomiting. 30 tablet 0  . simvastatin (ZOCOR) 10 MG tablet Take 1 tablet (10 mg total) by mouth at bedtime. SCHEDULE PHYSICAL FOR May 2017 30 tablet 2  . traMADol (ULTRAM) 50 MG tablet Take 1 tablet (50 mg total) by mouth 3 (three) times daily as needed for moderate pain. 40 tablet 0   No current facility-administered medications on file prior to visit.    Allergies  Allergen Reactions  . Morphine And Related Other (See Comments)    Migraine headaches    Past Medical History  Diagnosis Date  . Chicken pox   . Depression   . Frequent headaches   . Allergy   . Hypertension   . Kidney stones   . Hyperlipidemia   . Diabetes mellitus without complication ([place])   . Syncope and collapse     Past Surgical History  Procedure Laterality Date  . Wrist surgery Left 1986    Left finger  . Retinal detachment surgery Right   . Cataract extraction, bilateral  04/29/2014,07/08/2014  . Colonoscopy N/A 03/30/2015    Procedure: COLONOSCOPY WITH PROPOL;  Surgeon: Lollie Sails, MD;  Location: [facility] END[facility]COPY;  Service: Endoscopy;  Laterality: N/A;    Family History  Problem Relation Age of Onset  . Arthritis Mother   . Cancer Mother   . Hyperlipidemia Mother   . Diabetes Mother   . Hyperlipidemia Father   . Arthritis Maternal Grandmother   . Diabetes Maternal Grandmother   . Arthritis Maternal Grandfather   . Hyperlipidemia Maternal Grandfather   . Heart disease Maternal Grandfather   . Stroke Maternal Grandfather   . Cancer Paternal Grandmother   . Diabetes Paternal Grandfather     Social History   Social History  . Marital Status: Single    Spouse Name: N/A  . Number of Children: N/A  .  Years of Education: N/A   Occupational History  . Not on file.   Social History Main Topics  . Smoking status: Former Smoker -- 2 years    Types: Cigarettes  . Smokeless tobacco: Never Used  . Alcohol Use: Yes     Comment: occasional  . Drug Use: No  . Sexual Activity: Not on file   Other Topics Concern  . Not on file   Social History Narrative   The PMH, PSH, Social History, Family History, Medications, and allergies have been reviewed in [facility], and have been updated if relevant.  OBJECTIVE: BP 126/78 mmHg  Pulse 67  Temp(Src) 97.7 F (36.5 C) (Oral)  Wt 251 lb 8 oz (114.08 kg)  SpO2 97%  She appears well, vital signs are as noted. Ears normal.  Throat and pharynx normal.  Neck supple. No adenopathy in  the neck. Nose is congested. Sinuses tender. The chest is clear, without wheezes or rales.  ASSESSMENT:  sinusitis  PLAN:  Given duration and progression of symptoms, will treat for bacterial sinusitis.   Symptomatic therapy suggested: push fluids, rest and return office visit prn if symptoms persist or worsen.Call or return to clinic prn if these symptoms worsen or fail to improve as anticipated.

## 2016-01-21 NOTE — Progress Notes (Signed)
Pre visit review using our clinic review tool, if applicable. No additional management support is needed unless otherwise documented below in the visit note. 

## 2016-03-01 ENCOUNTER — Other Ambulatory Visit: Payer: Self-pay | Admitting: Internal Medicine

## 2016-03-02 NOTE — Telephone Encounter (Signed)
Rx called in to pharmacy. 

## 2016-03-02 NOTE — Telephone Encounter (Signed)
Tramadol last filled 04/2015--please advise

## 2016-03-02 NOTE — Telephone Encounter (Signed)
Ok to phone in Tramadol 

## 2016-03-11 ENCOUNTER — Ambulatory Visit: Payer: Self-pay | Admitting: Family Medicine

## 2016-03-11 ENCOUNTER — Encounter: Payer: Self-pay | Admitting: Emergency Medicine

## 2016-03-11 ENCOUNTER — Other Ambulatory Visit: Payer: Self-pay

## 2016-03-11 ENCOUNTER — Emergency Department: Payer: BLUE CROSS/BLUE SHIELD

## 2016-03-11 ENCOUNTER — Telehealth: Payer: Self-pay | Admitting: Internal Medicine

## 2016-03-11 ENCOUNTER — Emergency Department
Admission: EM | Admit: 2016-03-11 | Discharge: 2016-03-11 | Disposition: A | Discharge status: Home / Self Care | Payer: BLUE CROSS/BLUE SHIELD | Attending: Emergency Medicine | Admitting: Emergency Medicine

## 2016-03-11 DIAGNOSIS — Z79899 Other long term (current) drug therapy: Secondary | ICD-10-CM | POA: Insufficient documentation

## 2016-03-11 DIAGNOSIS — I10 Essential (primary) hypertension: Secondary | ICD-10-CM | POA: Diagnosis not present

## 2016-03-11 DIAGNOSIS — Z791 Long term (current) use of non-steroidal anti-inflammatories (NSAID): Secondary | ICD-10-CM | POA: Insufficient documentation

## 2016-03-11 DIAGNOSIS — Z7984 Long term (current) use of oral hypoglycemic drugs: Secondary | ICD-10-CM | POA: Diagnosis not present

## 2016-03-11 DIAGNOSIS — E785 Hyperlipidemia, unspecified: Secondary | ICD-10-CM | POA: Insufficient documentation

## 2016-03-11 DIAGNOSIS — R079 Chest pain, unspecified: Secondary | ICD-10-CM

## 2016-03-11 DIAGNOSIS — R0789 Other chest pain: Secondary | ICD-10-CM | POA: Insufficient documentation

## 2016-03-11 DIAGNOSIS — F329 Major depressive disorder, single episode, unspecified: Secondary | ICD-10-CM | POA: Insufficient documentation

## 2016-03-11 DIAGNOSIS — E119 Type 2 diabetes mellitus without complications: Secondary | ICD-10-CM | POA: Diagnosis not present

## 2016-03-11 DIAGNOSIS — Z87891 Personal history of nicotine dependence: Secondary | ICD-10-CM | POA: Insufficient documentation

## 2016-03-11 LAB — BASIC METABOLIC PANEL
Anion gap: 9 (ref 5–15)
BUN: 13 mg/dL (ref 6–20)
CHLORIDE: 105 mmol/L (ref 101–111)
CO2: 23 mmol/L (ref 22–32)
CREATININE: 0.75 mg/dL (ref 0.44–1.00)
Calcium: 9.6 mg/dL (ref 8.9–10.3)
GFR calc non Af Amer: >60 mL/min (ref >60)
GLUCOSE: 177 mg/dL — AB (ref 65–99)
Potassium: 3.7 mmol/L (ref 3.5–5.1)
Sodium: 137 mmol/L (ref 135–145)

## 2016-03-11 LAB — CBC
HCT: 38.8 % (ref 35.0–47.0)
HEMOGLOBIN: 13.6 g/dL (ref 12.0–16.0)
MCH: 29.9 pg (ref 26.0–34.0)
MCHC: 35 g/dL (ref 32.0–36.0)
MCV: 85.3 fL (ref 80.0–100.0)
PLATELETS: 265 10*3/uL (ref 150–440)
RBC: 4.54 MIL/uL (ref 3.80–5.20)
RDW: 13.4 % (ref 11.5–14.5)
WBC: 10.4 10*3/uL (ref 3.6–11.0)

## 2016-03-11 LAB — TROPONIN I: Troponin I: <0.03 ng/mL (ref <0.031)

## 2016-03-11 NOTE — Discharge Instructions (Signed)
Nonspecific Chest Pain  °Chest pain can be caused by many different conditions. There is always a chance that your pain could be related to something serious, such as a heart attack or a blood clot in your lungs. Chest pain can also be caused by conditions that are not life-threatening. If you have chest pain, it is very important to follow up with your health care provider. °CAUSES  °Chest pain can be caused by: °· Heartburn. °· Pneumonia or bronchitis. °· Anxiety or stress. °· Inflammation around your heart (pericarditis) or lung (pleuritis or pleurisy). °· A blood clot in your lung. °· A collapsed lung (pneumothorax). It can develop suddenly on its own (spontaneous pneumothorax) or from trauma to the chest. °· Shingles infection (varicella-zoster virus). °· Heart attack. °· Damage to the bones, muscles, and cartilage that make up your chest wall. This can include: °¨ Bruised bones due to injury. °¨ Strained muscles or cartilage due to frequent or repeated coughing or overwork. °¨ Fracture to one or more ribs. °¨ Sore cartilage due to inflammation (costochondritis). °RISK FACTORS  °Risk factors for chest pain may include: °· Activities that increase your risk for trauma or injury to your chest. °· Respiratory infections or conditions that cause frequent coughing. °· Medical conditions or overeating that can cause heartburn. °· Heart disease or family history of heart disease. °· Conditions or health behaviors that increase your risk of developing a blood clot. °· Having had chicken pox (varicella zoster). °SIGNS AND SYMPTOMS °Chest pain can feel like: °· Burning or tingling on the surface of your chest or deep in your chest. °· Crushing, pressure, aching, or squeezing pain. °· Dull or sharp pain that is worse when you move, cough, or take a deep breath. °· Pain that is also felt in your back, neck, shoulder, or arm, or pain that spreads to any of these areas. °Your chest pain may come and go, or it may stay  constant. °DIAGNOSIS °Lab tests or other studies may be needed to find the cause of your pain. Your health care provider may have you take a test called an ambulatory ECG (electrocardiogram). An ECG records your heartbeat patterns at the time the test is performed. You may also have other tests, such as: °· Transthoracic echocardiogram (TTE). During echocardiography, sound waves are used to create a picture of all of the heart structures and to look at how blood flows through your heart. °· Transesophageal echocardiogram (TEE). This is a more advanced imaging test that obtains images from inside your body. It allows your health care provider to see your heart in finer detail. °· Cardiac monitoring. This allows your health care provider to monitor your heart rate and rhythm in real time. °· Holter monitor. This is a portable device that records your heartbeat and can help to diagnose abnormal heartbeats. It allows your health care provider to track your heart activity for several days, if needed. °· Stress tests. These can be done through exercise or by taking medicine that makes your heart beat more quickly. °· Blood tests. °· Imaging tests. °TREATMENT  °Your treatment depends on what is causing your chest pain. Treatment may include: °· Medicines. These may include: °¨ Acid blockers for heartburn. °¨ Anti-inflammatory medicine. °¨ Pain medicine for inflammatory conditions. °¨ Antibiotic medicine, if an infection is present. °¨ Medicines to dissolve blood clots. °¨ Medicines to treat coronary artery disease. °· Supportive care for conditions that do not require medicines. This may include: °¨ Resting. °¨ Applying heat   or cold packs to injured areas. °¨ Limiting activities until pain decreases. °HOME CARE INSTRUCTIONS °· If you were prescribed an antibiotic medicine, finish it all even if you start to feel better. °· Avoid any activities that bring on chest pain. °· Do not use any tobacco products, including  cigarettes, chewing tobacco, or electronic cigarettes. If you need help quitting, ask your health care provider. °· Do not drink alcohol. °· Take medicines only as directed by your health care provider. °· Keep all follow-up visits as directed by your health care provider. This is important. This includes any further testing if your chest pain does not go away. °· If heartburn is the cause for your chest pain, you may be told to keep your head raised (elevated) while sleeping. This reduces the chance that acid will go from your stomach into your esophagus. °· Make lifestyle changes as directed by your health care provider. These may include: °¨ Getting regular exercise. Ask your health care provider to suggest some activities that are safe for you. °¨ Eating a heart-healthy diet. A registered dietitian can help you to learn healthy eating options. °¨ Maintaining a healthy weight. °¨ Managing diabetes, if necessary. °¨ Reducing stress. °SEEK MEDICAL CARE IF: °· Your chest pain does not go away after treatment. °· You have a rash with blisters on your chest. °· You have a fever. °SEEK IMMEDIATE MEDICAL CARE IF:  °· Your chest pain is worse. °· You have an increasing cough, or you cough up blood. °· You have severe abdominal pain. °· You have severe weakness. °· You faint. °· You have chills. °· You have sudden, unexplained chest discomfort. °· You have sudden, unexplained discomfort in your arms, back, neck, or jaw. °· You have shortness of breath at any time. °· You suddenly start to sweat, or your skin gets clammy. °· You feel nauseous or you vomit. °· You suddenly feel light-headed or dizzy. °· Your heart begins to beat quickly, or it feels like it is skipping beats. °These symptoms may represent a serious problem that is an emergency. Do not wait to see if the symptoms will go away. Get medical help right away. Call your local emergency services (911 in the [place] Do not drive yourself to the hospital. °  °This  information is not intended to replace advice given to you by your health care provider. Make sure you discuss any questions you have with your health care provider. °  °Document Released: 06/29/2005 Document Revised: 10/10/2014 Document Reviewed: 04/25/2014 °Elsevier Interactive Patient Education ©2016 [employer] ° °

## 2016-03-11 NOTE — ED Notes (Signed)
Chest pain and tightness.  SOB.  Symptoms started Tuesday night after eating spaghetti.    Denies N/V

## 2016-03-11 NOTE — Telephone Encounter (Signed)
Per Dr.Cook he recommended patient go to Emergency Department. Patient was called and stated she would go due to her still having symptoms.

## 2016-03-11 NOTE — Telephone Encounter (Signed)
[facility] scheduled appt with Dr Thersa Salt 03/11/16 at 4:15.

## 2016-03-11 NOTE — Telephone Encounter (Signed)
Patient Name: Tasha Leonard DOB: [birth year] Initial Comment Caller States she would like an appointment, she is feeling tired and out of breath (only when like walking or being active), pain in her left chest (does not think it is related to her heart) as well as breast tenderness Nurse Assessment Nurse: Ronnald Ramp, RN, Miranda Date/Time (Eastern Time): 03/11/2016 8:58:58 AM Confirm and document reason for call. If symptomatic, describe symptoms. You must click the next button to save text entered. ---Caller states she has been having pain in her the left side of her chest off and on since Wednesday. Not currently having pain. The pain lasts around 5 min. She is also feeling tired and SOB when active. Has the patient traveled out of the country within the last 30 days? ---No Does the patient have any new or worsening symptoms? ---Yes Will a triage be completed? ---Yes Related visit to physician within the last 2 weeks? ---No Does the PT have any chronic conditions? (i.e. diabetes, asthma, etc.) ---Yes List chronic conditions. ---Diabetes, HTN, High Cholesterol, Allergies Is the patient pregnant or possibly pregnant? (Ask all females between the ages of 48-55) ---No Is this a behavioral health or substance abuse call? ---No Guidelines Guideline Title Affirmed Question Affirmed Notes Chest Pain [1] Chest pain lasting <= 5 minutes AND [2] NO chest pain or cardiac symptoms now (Exceptions: pains lasting a few seconds) Final Disposition User See Physician within 24 Hours Ronnald Ramp, Therapist, sports, Miranda Comments No appt available with PCP or at primary office. Appt scheduled for today at [facility] with Dr. Lacinda Axon at 4:15p Referrals [place] - SPECIFY Disagree/Comply: Comply

## 2016-03-11 NOTE — ED Provider Notes (Signed)
[facility] Emergency Department Provider Note        Time seen: ----------------------------------------- 3:07 PM on 03/11/2016 -----------------------------------------    I have reviewed the triage vital signs and the nursing notes.   HISTORY  Chief Complaint Chest Pain    HPI Tasha Leonard is a 55 y.o. female who presents ER for chest pain and chest tightness. She describes shortness of breath as well. Symptoms started Tuesday night after eating spaghetti.Nothing makes her symptoms better or worse. She states it is episodic, she has never had it happen before. She does not have a family history of heart disease.   Past Medical History  Diagnosis Date  . Chicken pox   . Depression   . Frequent headaches   . Allergy   . Hypertension   . Kidney stones   . Hyperlipidemia   . Diabetes mellitus without complication ([place])   . Syncope and collapse     Patient Active Problem List   Diagnosis Date Noted  . DM type 2 (diabetes mellitus, type 2) ([place]) 11/08/2013  . Hypertriglyceridemia 11/08/2013  . Severe obesity (BMI >= 40) ([place]) 10/29/2013  . HTN (hypertension) 10/29/2013    Past Surgical History  Procedure Laterality Date  . Wrist surgery Left 1986    Left finger  . Retinal detachment surgery Right   . Cataract extraction, bilateral  04/29/2014,07/08/2014  . Colonoscopy N/A 03/30/2015    Procedure: COLONOSCOPY WITH PROPOL;  Surgeon: Lollie Sails, MD;  Location: [facility] ENDOSCOPY;  Service: Endoscopy;  Laterality: N/A;    Allergies Morphine and related  Social History Social History  Substance Use Topics  . Smoking status: Former Smoker -- 2 years    Types: Cigarettes  . Smokeless tobacco: Never Used  . Alcohol Use: Yes     Comment: occasional    Review of Systems Constitutional: Negative for fever. Cardiovascular: Positive for chest pain Respiratory: Negative for shortness of breath. Gastrointestinal: Negative for  abdominal pain, vomiting and diarrhea. Genitourinary: Negative for dysuria. Musculoskeletal: Negative for back pain. Skin: Negative for rash. Neurological: Negative for headaches, focal weakness or numbness.  10-point ROS otherwise negative.  ____________________________________________   PHYSICAL EXAM:  VITAL SIGNS: ED Triage Vitals  Enc Vitals Group     BP 03/11/16 1442 163/91 mmHg     Pulse Rate 03/11/16 1440 88     Resp 03/11/16 1440 18     Temp 03/11/16 1440 98.6 F (37 C)     Temp Source 03/11/16 1440 Oral     SpO2 03/11/16 1440 98 %     Weight 03/11/16 1440 250 lb (113.399 kg)     Height 03/11/16 1440 5\' 4"  (1.626 m)     Head Cir --      Peak Flow --      Pain Score 03/11/16 1441 5     Pain Loc --      Pain Edu? --      Excl. in [place]? --     Constitutional: Alert and oriented. Well appearing and in no distress. Eyes: Conjunctivae are normal. PERRL. Normal extraocular movements. ENT   Head: Normocephalic and atraumatic.   Nose: No congestion/rhinnorhea.   Mouth/Throat: Mucous membranes are moist.   Neck: No stridor. Cardiovascular: Normal rate, regular rhythm. No murmurs, rubs, or gallops. Respiratory: Normal respiratory effort without tachypnea nor retractions. Breath sounds are clear and equal bilaterally. No wheezes/rales/rhonchi. Gastrointestinal: Soft and nontender. Normal bowel sounds Musculoskeletal: Nontender with normal range of motion in all extremities.  No lower extremity tenderness nor edema. Neurologic:  Normal speech and language. No gross focal neurologic deficits are appreciated.  Skin:  Skin is warm, dry and intact. No rash noted. Psychiatric: Mood and affect are normal. Speech and behavior are normal.  ____________________________________________  EKG: Interpreted by me.Sinus rhythm with a rate of 87 bpm, normal PR interval, normal QRS, normal QT interval. Leftward axis.  ____________________________________________  ED  COURSE:  Pertinent labs & imaging results that were available during my care of the patient were reviewed by me and considered in my medical decision making (see chart for details). Patient's no acute distress, will check basic labs and reevaluate. ____________________________________________    LABS (pertinent positives/negatives)  Labs Reviewed  BASIC METABOLIC PANEL - Abnormal; Notable for the following:    Glucose, Bld 177 (*)    All other components within normal limits  CBC  TROPONIN I    RADIOLOGY  Chest x-ray  IMPRESSION: No active cardiopulmonary disease. ____________________________________________  FINAL ASSESSMENT AND PLAN  Chest pain  Plan: Patient with labs and imaging as dictated above. Patient presents with a heart score of 3. Despite her risk factors, low risk for ACS. I will refer her to cardiology for outpatient follow-up and stress testing.   Earleen Newport, MD   Note: This dictation was prepared with Dragon dictation. Any transcriptional errors that result from this process are unintentional   Earleen Newport, MD 03/11/16 [phone]

## 2016-03-11 NOTE — Telephone Encounter (Signed)
Patient should be evaluated in the emergency department.

## 2016-04-12 ENCOUNTER — Ambulatory Visit (INDEPENDENT_AMBULATORY_CARE_PROVIDER_SITE_OTHER): Payer: BLUE CROSS/BLUE SHIELD | Admitting: Internal Medicine

## 2016-04-12 ENCOUNTER — Encounter: Payer: Self-pay | Admitting: Internal Medicine

## 2016-04-12 VITALS — BP 162/104 | HR 63 | Temp 98.3°F | Wt 255.5 lb

## 2016-04-12 DIAGNOSIS — E119 Type 2 diabetes mellitus without complications: Secondary | ICD-10-CM

## 2016-04-12 DIAGNOSIS — E781 Pure hyperglyceridemia: Secondary | ICD-10-CM | POA: Diagnosis not present

## 2016-04-12 DIAGNOSIS — E785 Hyperlipidemia, unspecified: Secondary | ICD-10-CM

## 2016-04-12 DIAGNOSIS — I10 Essential (primary) hypertension: Secondary | ICD-10-CM | POA: Diagnosis not present

## 2016-04-12 NOTE — Assessment & Plan Note (Signed)
Encouraged her to consume a low fat diet CMET and Lipid profile today If LDL not at goal, will have her restart Zocor and Fish Oil

## 2016-04-12 NOTE — Progress Notes (Signed)
Pre visit review using our clinic review tool, if applicable. No additional management support is needed unless otherwise documented below in the visit note. 

## 2016-04-12 NOTE — Patient Instructions (Signed)

## 2016-04-12 NOTE — Assessment & Plan Note (Signed)
Encouraged her to work on diet and exercise 

## 2016-04-12 NOTE — Assessment & Plan Note (Signed)
Uncontrolled of meds She will start Lisinopril HCT today CBC and CMET today

## 2016-04-12 NOTE — Assessment & Plan Note (Signed)
Stopped Metformin secondary to diarrhea Will check A1C today No microalbumin secondary to ACEI therapy Encouraged yearly eye exam Foot exam today Flu and pneumonia vaccine UTD Encouraged her to consume a low carb, low fat diet

## 2016-04-12 NOTE — Progress Notes (Signed)
Subjective:    Patient ID: Tasha Leonard, female    DOB: 03/26/1961, 55 y.o.   MRN: [id number]  HPI  Pt presents to the clinic today for follow up of chronic conditions.  HTN: She stopped taking her Lisinopril-HCT because she was tired of taking her medication. BP today 162/104. ECG from 03/2016 reviewed.  HLD: Total cholesterol 192, LDL 114, HDL 41.6. Triglycerides remain elevated at 309. She stopped taking Zocor and Fish Oil because she was tired of taking her medication. She tries to consume a low fat diet.  DM2: She does not check her sugars. Last A1C was 6.4 %. She stopped her Metformin 1000 mg BID due to intermittent diarrhea. Last eye exam 10/15. Flu shot 11/15. Pneumovax 08/2014.  Obesity: has gained 10 lbs over the last 6 months. She has not been working on diet or exercise.  Review of Systems  Past Medical History  Diagnosis Date  . Chicken pox   . Depression   . Frequent headaches   . Allergy   . Hypertension   . Kidney stones   . Hyperlipidemia   . Diabetes mellitus without complication ([place])   . Syncope and collapse     Current Outpatient Prescriptions  Medication Sig Dispense Refill  . Blood Glucose Monitoring Suppl (ONE TOUCH ULTRA SYSTEM KIT) W/DEVICE KIT 1 kit by Does not apply route once. 1 each 0  . cyclobenzaprine (FLEXERIL) 10 MG tablet TAKE 1/2 TO 1 TABLET BY MOUTH AT BEDTIMEAS NEEDED FOR MUSCLE SPASMS 15 tablet 1  . glucose blood (ONE TOUCH TEST STRIPS) test strip 1 each by Other route 3 (three) times daily. 200 each 3  . ibuprofen (ADVIL,MOTRIN) 400 MG tablet Take 400 mg by mouth daily as needed.    [place] Kitchen lisinopril-hydrochlorothiazide (PRINZIDE,ZESTORETIC) 10-12.5 MG tablet Take 1 tablet by mouth daily. MUST SCHEDULE ANNUAL EXAM 30 tablet 1  . Loratadine (CLARITIN) 10 MG CAPS Take 1 capsule by mouth daily.    . metFORMIN (GLUCOPHAGE) 1000 MG tablet Take 1 tablet (1,000 mg total) by mouth 2 (two) times daily with a meal. SCHEDULE PHYSICAL FOR May 2017  60 tablet 2  . Nepafenac (ILEVRO) 0.3 % SUSP Apply 1 drop to eye.    . Omega-3 Fatty Acids (FISH OIL PO) Take 1 capsule by mouth daily.    . ONE TOUCH LANCETS MISC 1 each by Does not apply route 3 (three) times daily. 200 each 3  . promethazine (PHENERGAN) 12.5 MG tablet Take 1 tablet (12.5 mg total) by mouth every 6 (six) hours as needed for nausea or vomiting. 30 tablet 0  . traMADol (ULTRAM) 50 MG tablet TAKE 1 TABLET BY MOUTH 3 TIMES A DAY AS NEEDED FOR PAIN 40 tablet 0  . simvastatin (ZOCOR) 10 MG tablet Take 1 tablet (10 mg total) by mouth at bedtime. SCHEDULE PHYSICAL FOR May 2017 30 tablet 2   No current facility-administered medications for this visit.    Allergies  Allergen Reactions  . Morphine And Related Other (See Comments)    Migraine headaches    Family History  Problem Relation Age of Onset  . Arthritis Mother   . Cancer Mother   . Hyperlipidemia Mother   . Diabetes Mother   . Hyperlipidemia Father   . Arthritis Maternal Grandmother   . Diabetes Maternal Grandmother   . Arthritis Maternal Grandfather   . Hyperlipidemia Maternal Grandfather   . Heart disease Maternal Grandfather   . Stroke Maternal Grandfather   . Cancer Paternal  Grandmother   . Diabetes Paternal Grandfather     Social History   Social History  . Marital Status: Single    Spouse Name: N/A  . Number of Children: N/A  . Years of Education: N/A   Occupational History  . Not on file.   Social History Main Topics  . Smoking status: Former Smoker -- 2 years    Types: Cigarettes  . Smokeless tobacco: Never Used  . Alcohol Use: Yes     Comment: occasional  . Drug Use: No  . Sexual Activity: Not on file   Other Topics Concern  . Not on file   Social History Narrative     Constitutional: Denies fever, malaise, fatigue, headache or abrupt weight changes.  HEENT: Denies eye pain, eye redness, ear pain, ringing in the ears, wax buildup, runny nose, nasal congestion, bloody nose, or  sore throat. Respiratory:  Denies difficulty breathing, shortness of breath, cough or sputum production.   Cardiovascular: Denies chest pain, chest tightness, palpitations or swelling in the hands or feet.  Gastrointestinal: Pt reports diarrhea.Denies abdominal pain, bloating, constipation, diarrhea or blood in the stool.  Skin: Denies redness, rashes, lesions or ulcercations.  Neurological: Pt has some occasional tingling in her hands. Denies dizziness, difficulty with memory, difficulty with speech or problems with balance and coordination.   No other specific complaints in a complete review of systems (except as listed in HPI above).     Objective:   Physical Exam   BP 162/104 mmHg  Pulse 63  Temp(Src) 98.3 F (36.8 C) (Oral)  Wt 255 lb 8 oz (115.894 kg)  SpO2 98%  Wt Readings from Last 3 Encounters:  04/12/16 255 lb 8 oz (115.894 kg)  03/11/16 250 lb (113.399 kg)  01/21/16 251 lb 8 oz (114.08 kg)    General: Appears her stated age, obese but well developed, well nourished in NAD. Skin: Warm, dry and intact. No rashes, lesions or ulcerations noted. Cardiovascular: Normal rate and rhythm. S1,S2 noted.  No murmur, rubs or gallops noted. No JVD or BLE edema. No carotid bruits noted. Pulmonary/Chest: Normal effort and positive vesicular breath sounds. No respiratory distress. No wheezes, rales or ronchi noted.  Abdomen: Soft and nontender. Normal bowel sounds No distention or masses noted. Liver, spleen and kidneys non palpable. Neurological: Sensation intact to BLE.  BMET    Component Value Date/Time   NA 137 03/11/2016 1444   K 3.7 03/11/2016 1444   K 3.8 06/30/2014 1546   CL 105 03/11/2016 1444   CO2 23 03/11/2016 1444   GLUCOSE 177* 03/11/2016 1444   BUN 13 03/11/2016 1444   CREATININE 0.75 03/11/2016 1444   CALCIUM 9.6 03/11/2016 1444   GFRNONAA >60 03/11/2016 1444   GFRAA >60 03/11/2016 1444    Lipid Panel     Component Value Date/Time   CHOL 192 02/05/2015  1512   TRIG 309.0* 02/05/2015 1512   HDL 41.60 02/05/2015 1512   CHOLHDL 5 02/05/2015 1512   VLDL 61.8* 02/05/2015 1512   LDLCALC 115* 02/03/2014 0932    CBC    Component Value Date/Time   WBC 10.4 03/11/2016 1444   RBC 4.54 03/11/2016 1444   HGB 13.6 03/11/2016 1444   HCT 38.8 03/11/2016 1444   PLT 265 03/11/2016 1444   MCV 85.3 03/11/2016 1444   MCH 29.9 03/11/2016 1444   MCHC 35.0 03/11/2016 1444   RDW 13.4 03/11/2016 1444    Hgb A1C Lab Results  Component Value Date  HGBA1C 7.6* 07/03/2015        Assessment & Plan:   RTC in 6 months for physical exam

## 2016-04-13 LAB — COMPREHENSIVE METABOLIC PANEL
ALBUMIN: 4.3 g/dL (ref 3.5–5.2)
ALK PHOS: 72 U/L (ref 39–117)
ALT: 13 U/L (ref 0–35)
AST: 17 U/L (ref 0–37)
BILIRUBIN TOTAL: 0.3 mg/dL (ref 0.2–1.2)
BUN: 11 mg/dL (ref 6–23)
CO2: 28 mEq/L (ref 19–32)
Calcium: 9.4 mg/dL (ref 8.4–10.5)
Chloride: 104 mEq/L (ref 96–112)
Creatinine, Ser: 0.68 mg/dL (ref 0.40–1.20)
GFR: 95.87 mL/min (ref >60.00)
GLUCOSE: 149 mg/dL — AB (ref 70–99)
POTASSIUM: 3.8 meq/L (ref 3.5–5.1)
SODIUM: 139 meq/L (ref 135–145)
TOTAL PROTEIN: 7.5 g/dL (ref 6.0–8.3)

## 2016-04-13 LAB — LIPID PANEL
CHOLESTEROL: 238 mg/dL — AB (ref 0–200)
HDL: 41.6 mg/dL (ref >39.00)
NonHDL: 196.29
Total CHOL/HDL Ratio: 6
Triglycerides: 351 mg/dL — ABNORMAL HIGH (ref 0.0–149.0)
VLDL: 70.2 mg/dL — AB (ref 0.0–40.0)

## 2016-04-13 LAB — HEMOGLOBIN A1C: HEMOGLOBIN A1C: 7.5 % — AB (ref 4.6–6.5)

## 2016-04-13 LAB — CBC
HEMATOCRIT: 38.3 % (ref 36.0–46.0)
HEMOGLOBIN: 12.9 g/dL (ref 12.0–15.0)
MCHC: 33.6 g/dL (ref 30.0–36.0)
MCV: 86.7 fl (ref 78.0–100.0)
Platelets: 277 10*3/uL (ref 150.0–400.0)
RBC: 4.42 Mil/uL (ref 3.87–5.11)
RDW: 13.4 % (ref 11.5–15.5)
WBC: 10.3 10*3/uL (ref 4.0–10.5)

## 2016-04-13 LAB — LDL CHOLESTEROL, DIRECT: LDL DIRECT: 139 mg/dL

## 2016-04-15 ENCOUNTER — Other Ambulatory Visit: Payer: Self-pay

## 2016-04-15 ENCOUNTER — Telehealth: Payer: Self-pay | Admitting: Internal Medicine

## 2016-04-15 MED ORDER — SIMVASTATIN 10 MG PO TABS: 10.0000 mg | ORAL_TABLET | Freq: Every day | ORAL | Status: DC

## 2016-04-15 MED ORDER — GLIPIZIDE 5 MG PO TABS: 5.0000 mg | ORAL_TABLET | Freq: Two times a day (BID) | ORAL | Status: DC

## 2016-04-15 NOTE — Telephone Encounter (Signed)
Pt request glipizide and simvastatin sent to [facility] per lab result note. Advised pt refills done. Pt at work and will cb to schedule 3 mth f/u.

## 2016-04-15 NOTE — Telephone Encounter (Signed)
Pt returned call regarding labs  [phone]

## 2016-05-10 ENCOUNTER — Encounter: Payer: Self-pay | Admitting: Internal Medicine

## 2016-05-13 ENCOUNTER — Other Ambulatory Visit: Payer: Self-pay | Admitting: Internal Medicine

## 2016-07-13 ENCOUNTER — Ambulatory Visit: Payer: BLUE CROSS/BLUE SHIELD | Admitting: Internal Medicine

## 2016-07-18 ENCOUNTER — Encounter: Payer: Self-pay | Admitting: Internal Medicine

## 2016-07-18 ENCOUNTER — Ambulatory Visit (INDEPENDENT_AMBULATORY_CARE_PROVIDER_SITE_OTHER): Payer: BLUE CROSS/BLUE SHIELD | Admitting: Internal Medicine

## 2016-07-18 VITALS — BP 126/78 | HR 85 | Temp 99.1°F | Wt 255.8 lb

## 2016-07-18 DIAGNOSIS — E119 Type 2 diabetes mellitus without complications: Secondary | ICD-10-CM

## 2016-07-18 DIAGNOSIS — E7800 Pure hypercholesterolemia, unspecified: Secondary | ICD-10-CM

## 2016-07-18 DIAGNOSIS — Z23 Encounter for immunization: Secondary | ICD-10-CM

## 2016-07-18 DIAGNOSIS — I10 Essential (primary) hypertension: Secondary | ICD-10-CM | POA: Diagnosis not present

## 2016-07-18 LAB — COMPREHENSIVE METABOLIC PANEL
ALBUMIN: 4.2 g/dL (ref 3.5–5.2)
ALT: 13 U/L (ref 0–35)
AST: 18 U/L (ref 0–37)
Alkaline Phosphatase: 69 U/L (ref 39–117)
BUN: 18 mg/dL (ref 6–23)
CHLORIDE: 102 meq/L (ref 96–112)
CO2: 29 mEq/L (ref 19–32)
CREATININE: 0.93 mg/dL (ref 0.40–1.20)
Calcium: 9.6 mg/dL (ref 8.4–10.5)
GFR: 66.73 mL/min (ref >60.00)
Glucose, Bld: 226 mg/dL — ABNORMAL HIGH (ref 70–99)
POTASSIUM: 4.5 meq/L (ref 3.5–5.1)
SODIUM: 139 meq/L (ref 135–145)
TOTAL PROTEIN: 7.5 g/dL (ref 6.0–8.3)
Total Bilirubin: 0.3 mg/dL (ref 0.2–1.2)

## 2016-07-18 LAB — LIPID PANEL
Cholesterol: 209 mg/dL — ABNORMAL HIGH (ref 0–200)
HDL: 43 mg/dL (ref >39.00)
NONHDL: 165.54
Total CHOL/HDL Ratio: 5
Triglycerides: 219 mg/dL — ABNORMAL HIGH (ref 0.0–149.0)
VLDL: 43.8 mg/dL — AB (ref 0.0–40.0)

## 2016-07-18 LAB — HEMOGLOBIN A1C: Hgb A1c MFr Bld: 7.9 % — ABNORMAL HIGH (ref 4.6–6.5)

## 2016-07-18 LAB — LDL CHOLESTEROL, DIRECT: Direct LDL: 140 mg/dL

## 2016-07-18 NOTE — Assessment & Plan Note (Signed)
Encouraged her to continue to work on diet and exercise 

## 2016-07-18 NOTE — Progress Notes (Signed)
Subjective:    Patient ID: Tasha Leonard, female    DOB: 04-07-1961, 55 y.o.   MRN: [id number]  HPI  Pt presents to the clinic today for follow up of chronic conditions.  HTN: Her BP at her last visit was 162/104 because she decided to stop taking her medicaiton. She was advised to restart medication. She has been taking it as prescribed. Her BP today is 126/78. ECG from 03/2016 reviewed.  HLD: Total cholesterol 238, LDL 139, HDL 41.6. Triglycerides remain elevated at 351. She had stopped taking Zocor and Fish Oil prior to her last visit, because she was tired of taking her medication. She was advised to restart medication. She has been taking the medication as prescribed. She tries to consume a low fat diet.  DM2: She check her sugars intermittently. Her last A1C was 7.6 %. She stopped her Metformin 1000 mg BID due to intermittent diarrhea. Metformin was d/c'd at her last visit. She was started on Glipizide 5 mg BID. She has been taking the medication as prescribed. Last eye exam 3/17. Flu shot 11/15. Pneumovax 08/2014.  Obesity: Her weight has been stable over the last 3 months. She has been going to the gym for 45 minutes, 4 days a week, walking on the treadmill.  Review of Systems  Past Medical History:  Diagnosis Date  . Allergy   . Chicken pox   . Depression   . Diabetes mellitus without complication ([place])   . Frequent headaches   . Hyperlipidemia   . Hypertension   . Kidney stones   . Syncope and collapse     Current Outpatient Prescriptions  Medication Sig Dispense Refill  . Blood Glucose Monitoring Suppl (ONE TOUCH ULTRA SYSTEM KIT) W/DEVICE KIT 1 kit by Does not apply route once. 1 each 0  . cyclobenzaprine (FLEXERIL) 10 MG tablet TAKE 1/2 TO 1 TABLET BY MOUTH AT BEDTIMEAS NEEDED FOR MUSCLE SPASMS 15 tablet 1  . glipiZIDE (GLUCOTROL) 5 MG tablet Take 1 tablet (5 mg total) by mouth 2 (two) times daily before a meal. 60 tablet 2  . glucose blood (ONE TOUCH TEST  STRIPS) test strip 1 each by Other route 3 (three) times daily. 200 each 3  . ibuprofen (ADVIL,MOTRIN) 400 MG tablet Take 400 mg by mouth daily as needed.    Marland Kitchen lisinopril-hydrochlorothiazide (PRINZIDE,ZESTORETIC) 10-12.5 MG tablet TAKE 1 TABLET BY MOUTH ONCE A DAY 30 tablet 5  . Loratadine (CLARITIN) 10 MG CAPS Take 1 capsule by mouth daily.    . metFORMIN (GLUCOPHAGE) 1000 MG tablet Take 1 tablet (1,000 mg total) by mouth 2 (two) times daily with a meal. SCHEDULE PHYSICAL FOR May 2017 (Patient not taking: Reported on 04/12/2016) 60 tablet 2  . Nepafenac (ILEVRO) 0.3 % SUSP Apply 1 drop to eye.    . Omega-3 Fatty Acids (FISH OIL PO) Take 1 capsule by mouth daily.    . ONE TOUCH LANCETS MISC 1 each by Does not apply route 3 (three) times daily. 200 each 3  . simvastatin (ZOCOR) 10 MG tablet Take 1 tablet (10 mg total) by mouth at bedtime. 30 tablet 2  . traMADol (ULTRAM) 50 MG tablet TAKE 1 TABLET BY MOUTH 3 TIMES A DAY AS NEEDED FOR PAIN 40 tablet 0   No current facility-administered medications for this visit.     Allergies  Allergen Reactions  . Morphine And Related Other (See Comments)    Migraine headaches    Family History  Problem Relation Age  of Onset  . Arthritis Mother   . Cancer Mother   . Hyperlipidemia Mother   . Diabetes Mother   . Hyperlipidemia Father   . Arthritis Maternal Grandmother   . Diabetes Maternal Grandmother   . Arthritis Maternal Grandfather   . Hyperlipidemia Maternal Grandfather   . Heart disease Maternal Grandfather   . Stroke Maternal Grandfather   . Cancer Paternal Grandmother   . Diabetes Paternal Grandfather     Social History   Social History  . Marital status: Single    Spouse name: N/A  . Number of children: N/A  . Years of education: N/A   Occupational History  . Not on file.   Social History Main Topics  . Smoking status: Former Smoker    Years: 2.00    Types: Cigarettes  . Smokeless tobacco: Never Used  . Alcohol use Yes      Comment: occasional  . Drug use: No  . Sexual activity: Not on file   Other Topics Concern  . Not on file   Social History Narrative  . No narrative on file     Constitutional: Denies fever, malaise, fatigue, headache or abrupt weight changes.  HEENT: Denies eye pain, eye redness, ear pain, ringing in the ears, wax buildup, runny nose, nasal congestion, bloody nose, or sore throat. Respiratory:  Denies difficulty breathing, shortness of breath, cough or sputum production.   Cardiovascular: Denies chest pain, chest tightness, palpitations or swelling in the hands or feet.  Gastrointestinal: Denies abdominal pain, bloating, constipation, diarrhea or blood in the stool.  Skin: Denies redness, rashes, lesions or ulcercations.  Neurological: Pt has some occasional burning sensation in her feet. Denies dizziness, difficulty with memory, difficulty with speech or problems with balance and coordination.   No other specific complaints in a complete review of systems (except as listed in HPI above).     Objective:   Physical Exam   BP 126/78   Pulse 85   Temp 99.1 F (37.3 C) (Oral)   Wt 255 lb 12 oz (116 kg)   SpO2 97%   BMI 43.90 kg/m    Wt Readings from Last 3 Encounters:  04/12/16 255 lb 8 oz (115.9 kg)  03/11/16 250 lb (113.4 kg)  01/21/16 251 lb 8 oz (114.1 kg)    General: Appears her stated age, obese but well developed, well nourished in NAD. Skin: Warm, dry and intact.  Cardiovascular: Normal rate and rhythm. S1,S2 noted.  No murmur, rubs or gallops noted. No JVD or BLE edema. No carotid bruits noted. Pulmonary/Chest: Normal effort and positive vesicular breath sounds. No respiratory distress. No wheezes, rales or ronchi noted.  Neurological: Sensation intact to BLE.  BMET    Component Value Date/Time   NA 139 04/12/2016 1608   K 3.8 04/12/2016 1608   K 3.8 06/30/2014 1546   CL 104 04/12/2016 1608   CO2 28 04/12/2016 1608   GLUCOSE 149 (H) 04/12/2016 1608    BUN 11 04/12/2016 1608   CREATININE 0.68 04/12/2016 1608   CALCIUM 9.4 04/12/2016 1608   GFRNONAA >60 03/11/2016 1444   GFRAA >60 03/11/2016 1444    Lipid Panel     Component Value Date/Time   CHOL 238 (H) 04/12/2016 1608   TRIG 351.0 (H) 04/12/2016 1608   HDL 41.60 04/12/2016 1608   CHOLHDL 6 04/12/2016 1608   VLDL 70.2 (H) 04/12/2016 1608   LDLCALC 115 (H) 02/03/2014 0932    CBC    Component Value Date/Time  WBC 10.3 04/12/2016 1608   RBC 4.42 04/12/2016 1608   HGB 12.9 04/12/2016 1608   HCT 38.3 04/12/2016 1608   PLT 277.0 04/12/2016 1608   MCV 86.7 04/12/2016 1608   MCH 29.9 03/11/2016 1444   MCHC 33.6 04/12/2016 1608   RDW 13.4 04/12/2016 1608    Hgb A1C Lab Results  Component Value Date   HGBA1C 7.5 (H) 04/12/2016        Assessment & Plan:   RTC in 3 months for physical exam Webb Silversmith, NP

## 2016-07-18 NOTE — Assessment & Plan Note (Signed)
Will repeat A1C today No microalbumin secondary to ACEI therapy Flu shot today Pneumovax UTD Encouraged yearly eye exams Encouraged her to consume a low fat, low carb diet and continue exercise Continue Glipizide 5 mg BID unless directed otherwise

## 2016-07-18 NOTE — Assessment & Plan Note (Signed)
Will repeat Lipid Profile today Encouraged her to consume a low fat diet Continue Zocor and Fish Oil unless directed otherwise

## 2016-07-18 NOTE — Assessment & Plan Note (Signed)
Controlled on Lisinopril HCTZ CMET today

## 2016-07-18 NOTE — Patient Instructions (Signed)

## 2016-07-19 ENCOUNTER — Other Ambulatory Visit: Payer: Self-pay | Admitting: Internal Medicine

## 2016-07-19 NOTE — Addendum Note (Signed)
Addended by: Lurlean Nanny on: 07/19/2016 08:34 AM   Modules accepted: Orders

## 2016-07-20 MED ORDER — GLIPIZIDE 10 MG PO TABS: 10.0000 mg | ORAL_TABLET | Freq: Two times a day (BID) | ORAL | 2 refills | Status: DC

## 2016-07-20 MED ORDER — SIMVASTATIN 20 MG PO TABS: 20.0000 mg | ORAL_TABLET | Freq: Every day | ORAL | 2 refills | Status: DC

## 2016-07-20 NOTE — Addendum Note (Signed)
Addended by: Lurlean Nanny on: 07/20/2016 04:59 PM   Modules accepted: Orders

## 2016-09-21 ENCOUNTER — Other Ambulatory Visit: Payer: Self-pay | Admitting: Internal Medicine

## 2016-09-21 DIAGNOSIS — E119 Type 2 diabetes mellitus without complications: Secondary | ICD-10-CM

## 2016-10-26 ENCOUNTER — Other Ambulatory Visit: Payer: Self-pay | Admitting: Internal Medicine

## 2016-10-26 DIAGNOSIS — E7800 Pure hypercholesterolemia, unspecified: Secondary | ICD-10-CM

## 2016-11-07 ENCOUNTER — Encounter: Payer: Self-pay | Admitting: Internal Medicine

## 2016-11-07 ENCOUNTER — Ambulatory Visit (INDEPENDENT_AMBULATORY_CARE_PROVIDER_SITE_OTHER): Payer: BLUE CROSS/BLUE SHIELD | Admitting: Internal Medicine

## 2016-11-07 VITALS — BP 124/80 | HR 74 | Temp 97.7°F | Ht 64.5 in | Wt 256.0 lb

## 2016-11-07 DIAGNOSIS — Z0001 Encounter for general adult medical examination with abnormal findings: Secondary | ICD-10-CM | POA: Diagnosis not present

## 2016-11-07 DIAGNOSIS — E119 Type 2 diabetes mellitus without complications: Secondary | ICD-10-CM | POA: Diagnosis not present

## 2016-11-07 DIAGNOSIS — E781 Pure hyperglyceridemia: Secondary | ICD-10-CM

## 2016-11-07 DIAGNOSIS — G8929 Other chronic pain: Secondary | ICD-10-CM

## 2016-11-07 DIAGNOSIS — M545 Low back pain, unspecified: Secondary | ICD-10-CM | POA: Insufficient documentation

## 2016-11-07 DIAGNOSIS — I10 Essential (primary) hypertension: Secondary | ICD-10-CM

## 2016-11-07 DIAGNOSIS — Z23 Encounter for immunization: Secondary | ICD-10-CM

## 2016-11-07 DIAGNOSIS — H60543 Acute eczematoid otitis externa, bilateral: Secondary | ICD-10-CM

## 2016-11-07 DIAGNOSIS — L821 Other seborrheic keratosis: Secondary | ICD-10-CM

## 2016-11-07 LAB — COMPREHENSIVE METABOLIC PANEL
ALBUMIN: 4.3 g/dL (ref 3.5–5.2)
ALT: 11 U/L (ref 0–35)
AST: 16 U/L (ref 0–37)
Alkaline Phosphatase: 90 U/L (ref 39–117)
BUN: 16 mg/dL (ref 6–23)
CALCIUM: 9.2 mg/dL (ref 8.4–10.5)
CHLORIDE: 102 meq/L (ref 96–112)
CO2: 26 mEq/L (ref 19–32)
CREATININE: 0.79 mg/dL (ref 0.40–1.20)
GFR: 80.46 mL/min (ref >60.00)
Glucose, Bld: 309 mg/dL — ABNORMAL HIGH (ref 70–99)
Potassium: 4.5 mEq/L (ref 3.5–5.1)
Sodium: 137 mEq/L (ref 135–145)
Total Bilirubin: 0.4 mg/dL (ref 0.2–1.2)
Total Protein: 7.5 g/dL (ref 6.0–8.3)

## 2016-11-07 LAB — CBC
HCT: 41 % (ref 36.0–46.0)
Hemoglobin: 14.2 g/dL (ref 12.0–15.0)
MCHC: 34.6 g/dL (ref 30.0–36.0)
MCV: 86.5 fl (ref 78.0–100.0)
PLATELETS: 250 10*3/uL (ref 150.0–400.0)
RBC: 4.74 Mil/uL (ref 3.87–5.11)
RDW: 13.1 % (ref 11.5–15.5)
WBC: 8.1 10*3/uL (ref 4.0–10.5)

## 2016-11-07 LAB — LIPID PANEL
CHOLESTEROL: 191 mg/dL (ref 0–200)
HDL: 40.4 mg/dL (ref >39.00)
NonHDL: 150.2
Total CHOL/HDL Ratio: 5
Triglycerides: 237 mg/dL — ABNORMAL HIGH (ref 0.0–149.0)
VLDL: 47.4 mg/dL — AB (ref 0.0–40.0)

## 2016-11-07 LAB — LDL CHOLESTEROL, DIRECT: LDL DIRECT: 108 mg/dL

## 2016-11-07 LAB — HEMOGLOBIN A1C: Hgb A1c MFr Bld: 10.2 % — ABNORMAL HIGH (ref 4.6–6.5)

## 2016-11-07 LAB — TSH: TSH: 2.18 u[IU]/mL (ref 0.35–4.50)

## 2016-11-07 MED ORDER — TRAMADOL HCL 50 MG PO TABS: 50.0000 mg | ORAL_TABLET | Freq: Three times a day (TID) | ORAL | 0 refills | Status: DC | PRN

## 2016-11-07 MED ORDER — CYCLOBENZAPRINE HCL 10 MG PO TABS: 10.0000 mg | ORAL_TABLET | Freq: Every day | ORAL | 1 refills | Status: DC | PRN

## 2016-11-07 NOTE — Assessment & Plan Note (Signed)
Elevated today Recheck 124/80 Continue Lisinopril HCT for now CMET today Discussed the importance of low salt diet and exercise for weight loss

## 2016-11-07 NOTE — Assessment & Plan Note (Signed)
She is already going to the gym, advised her to increase this by 1-2 days a week Encouraged her to consume a low fat, low carb, 1500 calorie restricted diet

## 2016-11-07 NOTE — Assessment & Plan Note (Signed)
Flares occasionally Flexeril and Tramadol refilled today [facility] CSR reviewed- no other prescribes, not getting RX monthly Discussed the importance of weight loss, and how it would help with back pain Also discussed stretching exercises

## 2016-11-07 NOTE — Assessment & Plan Note (Signed)
Uncontrolled Will repeat A1C today No microalbumin secondary to ACEI Encouraged her to consume a low carb, low fat diet and exercise for weight loss Flu and pneumovax UTD Encouraged yearly eye exams Foot exam today Continue Glipizide Will add Januvia if A1C > 7%

## 2016-11-07 NOTE — Assessment & Plan Note (Signed)
Will check CMET and Lipid profile today Encouraged her to consume a low fat diet Continue Zocor and Fish Oil for now, will adjust if needed based on labs

## 2016-11-07 NOTE — Progress Notes (Signed)
Subjective:    Patient ID: Tasha Leonard, female    DOB: 1961/07/22, 56 y.o.   MRN: [id number]  HPI  Pt presents to the clinic today for her annual exam. She is also due for follow up of chronic conditions.  Environmental Allergies: Can occur at anytime. She takes Claritin as needed with good relief.   DM 2: Her last A1C was 7.9%, 07/2016. Her Glipizide was increased to 10 mg BID. She has been taking the medication as prescribed. She denies hypoglycemia. She does not routinely check her sugars unless she feels like her sugar is low. She looks at feet daily. She has an eye exam scheduled.   Obesity: Her weight today is 256 lbs with a BMI of 43.26. She is not currently working on any diet but is exercising, although she has not seen any change in her weight in the last 5 months.  HLD: Her last LDL was 140, triglycerides 219, 07/2016. Her Zocor was increased to 20 mg daily. She reports she has been taking medication as prescribed. She denies myalgias. She is taking Fish Oil as well. She does not consume a low fat diet.  HTN: She is taking Lisinopril-HCT. Her creatinine is normal. Her BP today is 128/84. ECG from 03/2016 reviewed.  Intermittent Low Back Pain: She describes the pain as tight and pulling. The pain does not radiate. She denies numbness and tingling down her legs. She denies any injury to the area. She takes Flexeril and Tramadol on an as needed basis. She is requesting a refill of these medications today.  Flu: 07/2016 Tetanus: 2007 Pneumovax: 08/2014 Pap Smear: 02/2015 Mammogram: 03/2015 Colon Screening: 03/2015 Vision Screening: 12/02/2015, has appt scheduled Dentist: yearly  Diet: She does eat meat. She consumes fruits and veggies daily. She does eat some fried food. She drinks mostly water, 1 soda daily. Exercise: She goes to the gym for 1 hour 4 x week.  Review of Systems      Past Medical History:  Diagnosis Date  . Allergy   . Chicken pox   . Depression   .  Diabetes mellitus without complication ([place])   . Frequent headaches   . Hyperlipidemia   . Hypertension   . Kidney stones   . Syncope and collapse     Current Outpatient Prescriptions  Medication Sig Dispense Refill  . Blood Glucose Monitoring Suppl (ONE TOUCH ULTRA SYSTEM KIT) W/DEVICE KIT 1 kit by Does not apply route once. 1 each 0  . cyclobenzaprine (FLEXERIL) 10 MG tablet TAKE 1/2 TO 1 TABLET BY MOUTH AT BEDTIMEAS NEEDED FOR MUSCLE SPASMS 15 tablet 1  . glipiZIDE (GLUCOTROL) 10 MG tablet TAKE 1 TABLET BY MOUTH TWICE A DAY BEFORE A MEAL. 60 tablet 1  . glucose blood (ONE TOUCH TEST STRIPS) test strip 1 each by Other route 3 (three) times daily. 200 each 3  . ibuprofen (ADVIL,MOTRIN) 400 MG tablet Take 400 mg by mouth daily as needed.    Marland Kitchen lisinopril-hydrochlorothiazide (PRINZIDE,ZESTORETIC) 10-12.5 MG tablet TAKE 1 TABLET BY MOUTH ONCE A DAY 30 tablet 5  . Loratadine (CLARITIN) 10 MG CAPS Take 1 capsule by mouth daily.    . Nepafenac (ILEVRO) 0.3 % SUSP Apply 1 drop to eye.    . Omega-3 Fatty Acids (FISH OIL PO) Take 1 capsule by mouth daily.    . ONE TOUCH LANCETS MISC 1 each by Does not apply route 3 (three) times daily. 200 each 3  . simvastatin (ZOCOR) 20 MG tablet  TAKE 1 TABLET BY MOUTH EVERY NIGHT AT BEDTIME 30 tablet 0  . traMADol (ULTRAM) 50 MG tablet TAKE 1 TABLET BY MOUTH 3 TIMES A DAY AS NEEDED FOR PAIN 40 tablet 0   No current facility-administered medications for this visit.     Allergies  Allergen Reactions  . Metformin And Related Other (See Comments)    GI upset  . Morphine And Related Other (See Comments)    Migraine headaches    Family History  Problem Relation Age of Onset  . Arthritis Mother   . Cancer Mother   . Hyperlipidemia Mother   . Diabetes Mother   . Hyperlipidemia Father   . Arthritis Maternal Grandmother   . Diabetes Maternal Grandmother   . Arthritis Maternal Grandfather   . Hyperlipidemia Maternal Grandfather   . Heart disease Maternal  Grandfather   . Stroke Maternal Grandfather   . Cancer Paternal Grandmother   . Diabetes Paternal Grandfather     Social History   Social History  . Marital status: Single    Spouse name: N/A  . Number of children: N/A  . Years of education: N/A   Occupational History  . Not on file.   Social History Main Topics  . Smoking status: Former Smoker    Years: 2.00    Types: Cigarettes  . Smokeless tobacco: Never Used  . Alcohol use Yes     Comment: occasional  . Drug use: No  . Sexual activity: Not on file   Other Topics Concern  . Not on file   Social History Narrative  . No narrative on file     Constitutional: Denies fever, malaise, fatigue, headache or abrupt weight changes.  HEENT: Pt reports itchy ears. Denies eye pain, eye redness, ear pain, ringing in the ears, wax buildup, runny nose, nasal congestion, bloody nose, or sore throat. Respiratory: Denies difficulty breathing, shortness of breath, cough or sputum production.   Cardiovascular: Pt reports swelling in her lower legs. Denies chest pain, chest tightness, palpitations or swelling in the hands or feet.  Gastrointestinal: Denies abdominal pain, bloating, constipation, diarrhea or blood in the stool.  GU: Denies urgency, frequency, pain with urination, burning sensation, blood in urine, odor or discharge. Musculoskeletal: Pt reports low back pain. Denies decrease in range of motion, difficulty with gait, or joint swelling.  Skin: Pt reports rash on legs. Denies redness, lesions or ulcercations.  Neurological: Denies dizziness, difficulty with memory, difficulty with speech or problems with balance and coordination.  Psych: Denies anxiety, depression, SI/HI.  No other specific complaints in a complete review of systems (except as listed in HPI above).  Objective:   Physical Exam   BP 128/84   Pulse 74   Temp 97.7 F (36.5 C) (Oral)   Ht 5' 4.5" (1.638 m)   Wt 256 lb (116.1 kg)   SpO2 98%   BMI 43.26  kg/m  Wt Readings from Last 3 Encounters:  11/07/16 256 lb (116.1 kg)  07/18/16 255 lb 12 oz (116 kg)  04/12/16 255 lb 8 oz (115.9 kg)    General: Appears her stated age, obese in NAD. Skin: Warm, dry and intact. She has multiple, scattered, oval seborrheic keratosis noted on bilateral thighs. HEENT: Head: normal shape and size; Eyes: sclera white, no icterus, conjunctiva pink, PERRLA and EOMs intact; Ears: Tm's gray and intact, normal light reflex, eczema noted of bilateral ear canals; Throat/Mouth: Teeth present, mucosa pink and moist, no exudate, lesions or ulcerations noted.  Neck:  Neck supple, trachea midline. No masses, lumps or thyromegaly present.  Cardiovascular: Normal rate and rhythm. S1,S2 noted.  No murmur, rubs or gallops noted. Trace nonpitting BLE edema. No carotid bruits noted. Pulmonary/Chest: Normal effort and positive vesicular breath sounds. No respiratory distress. No wheezes, rales or ronchi noted.  Abdomen: Soft and nontender. Normal bowel sounds. No distention or masses noted. Liver, spleen and kidneys non palpable. Musculoskeletal: Pain with palpation over the lumbar spine. Strength f5/5 BUE/BLE. No difficulty with gait.  Neurological: Alert and oriented. Cranial nerves II-XII grossly intact. Coordination normal.  Psychiatric: Mood and affect normal. Behavior is normal. Judgment and thought content normal.    BMET    Component Value Date/Time   NA 139 07/18/2016 0945   K 4.5 07/18/2016 0945   K 3.8 06/30/2014 1546   CL 102 07/18/2016 0945   CO2 29 07/18/2016 0945   GLUCOSE 226 (H) 07/18/2016 0945   BUN 18 07/18/2016 0945   CREATININE 0.93 07/18/2016 0945   CALCIUM 9.6 07/18/2016 0945   GFRNONAA >60 03/11/2016 1444   GFRAA >60 03/11/2016 1444    Lipid Panel     Component Value Date/Time   CHOL 209 (H) 07/18/2016 0945   TRIG 219.0 (H) 07/18/2016 0945   HDL 43.00 07/18/2016 0945   CHOLHDL 5 07/18/2016 0945   VLDL 43.8 (H) 07/18/2016 0945   LDLCALC  115 (H) 02/03/2014 0932    CBC    Component Value Date/Time   WBC 10.3 04/12/2016 1608   RBC 4.42 04/12/2016 1608   HGB 12.9 04/12/2016 1608   HCT 38.3 04/12/2016 1608   PLT 277.0 04/12/2016 1608   MCV 86.7 04/12/2016 1608   MCH 29.9 03/11/2016 1444   MCHC 33.6 04/12/2016 1608   RDW 13.4 04/12/2016 1608    Hgb A1C Lab Results  Component Value Date   HGBA1C 7.9 (H) 07/18/2016       Assessment & Plan:   Preventative Health Maintenance:  Flu and pneumovax UTD Tetanus booster today Pap Smear due 2021 She declines mammogram today, will get them every 5 years Colonoscopy UTD Encouraged her to consume a balanced diet and exercise regimen Advised her to see an eye doctor and dentist annually Will check CBC, CMET, Lipid, TSH and A1C today  Eczema of Ears:  Not bad enough to give her steroid ear drops Will monitor  Seborrheic Keratosis:  Offered referral to dermatology to have them frozen- she declines Will monitor  RTC in 6 months for follow up of chronic conditions Jaqulyn Chancellor, NP

## 2016-11-07 NOTE — Patient Instructions (Signed)

## 2016-11-09 ENCOUNTER — Encounter: Payer: Self-pay | Admitting: Internal Medicine

## 2016-11-10 ENCOUNTER — Ambulatory Visit (INDEPENDENT_AMBULATORY_CARE_PROVIDER_SITE_OTHER): Payer: BLUE CROSS/BLUE SHIELD | Admitting: Internal Medicine

## 2016-11-10 ENCOUNTER — Encounter: Payer: Self-pay | Admitting: Internal Medicine

## 2016-11-10 DIAGNOSIS — E781 Pure hyperglyceridemia: Secondary | ICD-10-CM

## 2016-11-10 DIAGNOSIS — E1165 Type 2 diabetes mellitus with hyperglycemia: Secondary | ICD-10-CM

## 2016-11-10 MED ORDER — INSULIN DETEMIR 100 UNIT/ML FLEXPEN: 18.0000 [IU] | PEN_INJECTOR | Freq: Every day | SUBCUTANEOUS | 11 refills | Status: DC

## 2016-11-10 NOTE — Assessment & Plan Note (Signed)
Uncontrolled Discussed low carb, low fat diet and exercise for weight loss Offered referral to diabetes nutrition or [facility] but she declines Offered referral to endocrinologist, but she declines at this time (I wonder if she may have Type 1.5) Continue Glipzide Discussed adding Januvia vs adding Lantus Lantus ordered 18 units QHS Monitor fasting sugars, update me in 2 weeks

## 2016-11-10 NOTE — Progress Notes (Signed)
Subjective:    Patient ID: Tasha Leonard, female    DOB: 1961-07-31, 56 y.o.   MRN: [id number]  HPI  Pt presents to the clinic today to follow up labs. She is concerned about her A1C of 10.2%, up from 7.9% in 3 months. Her triglycerides are up to 237, LDL is at [facility].. She is currently taking Glipizide 10 mg BID and Zocor daily as prescribed. She had diarrhea with Metformin. She reports her sugars range 200-300. She is eating either a bowl of cereal or 2 eggs for breakfast. For lunch, she will have a sandwich and some chips. For dinner, she will have meat, a vegetable and a starch. She does not snack in between meals or before bed. She exercises on a treadmill for 30-45 minutes at least 2 days a week.  Review of Systems      Past Medical History:  Diagnosis Date  . Allergy   . Chicken pox   . Diabetes mellitus without complication ([place])   . Hyperlipidemia   . Hypertension   . Kidney stones     Current Outpatient Prescriptions  Medication Sig Dispense Refill  . Blood Glucose Monitoring Suppl (ONE TOUCH ULTRA SYSTEM KIT) W/DEVICE KIT 1 kit by Does not apply route once. 1 each 0  . cyclobenzaprine (FLEXERIL) 10 MG tablet Take 1 tablet (10 mg total) by mouth daily as needed for muscle spasms. 15 tablet 1  . glipiZIDE (GLUCOTROL) 10 MG tablet TAKE 1 TABLET BY MOUTH TWICE A DAY BEFORE A MEAL. 60 tablet 1  . glucose blood (ONE TOUCH TEST STRIPS) test strip 1 each by Other route 3 (three) times daily. 200 each 3  . ibuprofen (ADVIL,MOTRIN) 400 MG tablet Take 400 mg by mouth daily as needed.    Marland Kitchen lisinopril-hydrochlorothiazide (PRINZIDE,ZESTORETIC) 10-12.5 MG tablet TAKE 1 TABLET BY MOUTH ONCE A DAY 30 tablet 5  . Loratadine (CLARITIN) 10 MG CAPS Take 1 capsule by mouth daily.    . Nepafenac (ILEVRO) 0.3 % SUSP Apply 1 drop to eye.    . Omega-3 Fatty Acids (FISH OIL PO) Take 1 capsule by mouth daily.    . ONE TOUCH LANCETS MISC 1 each by Does not apply route 3 (three) times daily. 200  each 3  . simvastatin (ZOCOR) 20 MG tablet TAKE 1 TABLET BY MOUTH EVERY NIGHT AT BEDTIME 30 tablet 0  . traMADol (ULTRAM) 50 MG tablet Take 1 tablet (50 mg total) by mouth 3 (three) times daily as needed. 15 tablet 0  . Insulin Detemir (LEVEMIR) 100 UNIT/ML Pen Inject 18 Units into the skin daily at 10 pm. 15 mL 11   No current facility-administered medications for this visit.     Allergies  Allergen Reactions  . Metformin And Related Other (See Comments)    GI upset  . Morphine And Related Other (See Comments)    Migraine headaches    Family History  Problem Relation Age of Onset  . Arthritis Mother   . Cancer Mother   . Hyperlipidemia Mother   . Diabetes Mother   . Hyperlipidemia Father   . Arthritis Maternal Grandmother   . Diabetes Maternal Grandmother   . Arthritis Maternal Grandfather   . Hyperlipidemia Maternal Grandfather   . Heart disease Maternal Grandfather   . Stroke Maternal Grandfather   . Cancer Paternal Grandmother   . Diabetes Paternal Grandfather     Social History   Social History  . Marital status: Single    Spouse name:  N/A  . Number of children: N/A  . Years of education: N/A   Occupational History  . Not on file.   Social History Main Topics  . Smoking status: Former Smoker    Years: 2.00    Types: Cigarettes  . Smokeless tobacco: Never Used  . Alcohol use Yes     Comment: occasional  . Drug use: No  . Sexual activity: Not on file   Other Topics Concern  . Not on file   Social History Narrative  . No narrative on file     Constitutional: Denies fever, malaise, fatigue, headache or abrupt weight changes.  Respiratory: Denies difficulty breathing, shortness of breath, cough or sputum production.   Cardiovascular: Denies chest pain, chest tightness, palpitations or swelling in the hands or feet.  Gastrointestinal: Denies abdominal pain, bloating, constipation, diarrhea or blood in the stool.  Skin: Denies redness, rashes, lesions  or ulcercations.    No other specific complaints in a complete review of systems (except as listed in HPI above).  Objective:   Physical Exam   BP 120/76   Pulse 82   Temp 98.2 F (36.8 C) (Oral)   Wt 256 lb (116.1 kg)   SpO2 98%   BMI 43.26 kg/m  Wt Readings from Last 3 Encounters:  11/10/16 256 lb (116.1 kg)  11/07/16 256 lb (116.1 kg)  07/18/16 255 lb 12 oz (116 kg)    General: Appears her stated age, obese in NAD. Skin: Warm, dry and intact. No ulcerations noted. Cardiovascular: Normal rate and rhythm.  Pulmonary/Chest: Normal effort and positive vesicular breath sounds. No respiratory distress. No wheezes, rales or ronchi noted.  Neurological: Alert and oriented. Sensation intact to BLE.   BMET    Component Value Date/Time   NA 137 11/07/2016 0849   K 4.5 11/07/2016 0849   K 3.8 06/30/2014 1546   CL 102 11/07/2016 0849   CO2 26 11/07/2016 0849   GLUCOSE 309 (H) 11/07/2016 0849   BUN 16 11/07/2016 0849   CREATININE 0.79 11/07/2016 0849   CALCIUM 9.2 11/07/2016 0849   GFRNONAA >60 03/11/2016 1444   GFRAA >60 03/11/2016 1444    Lipid Panel     Component Value Date/Time   CHOL 191 11/07/2016 0849   TRIG 237.0 (H) 11/07/2016 0849   HDL 40.40 11/07/2016 0849   CHOLHDL 5 11/07/2016 0849   VLDL 47.4 (H) 11/07/2016 0849   LDLCALC 115 (H) 02/03/2014 0932    CBC    Component Value Date/Time   WBC 8.1 11/07/2016 0849   RBC 4.74 11/07/2016 0849   HGB 14.2 11/07/2016 0849   HCT 41.0 11/07/2016 0849   PLT 250.0 11/07/2016 0849   MCV 86.5 11/07/2016 0849   MCH 29.9 03/11/2016 1444   MCHC 34.6 11/07/2016 0849   RDW 13.1 11/07/2016 0849    Hgb A1C Lab Results  Component Value Date   HGBA1C 10.2 (H) 11/07/2016           Assessment & Plan:

## 2016-11-10 NOTE — Patient Instructions (Signed)
Carbohydrate Counting for Diabetes Mellitus, Adult Carbohydrate counting is a method for keeping track of how many carbohydrates you eat. Eating carbohydrates naturally increases the amount of sugar (glucose) in the blood. Counting how many carbohydrates you eat helps keep your blood glucose within normal limits, which helps you manage your diabetes (diabetes mellitus). It is important to know how many carbohydrates you can safely have in each meal. This is different for every person. A diet and nutrition specialist (registered dietitian) can help you make a meal plan and calculate how many carbohydrates you should have at each meal and snack. Carbohydrates are found in the following foods:  Grains, such as breads and cereals.  Dried beans and soy products.  Starchy vegetables, such as potatoes, peas, and corn.  Fruit and fruit juices.  Milk and yogurt.  Sweets and snack foods, such as cake, cookies, candy, chips, and soft drinks. How do I count carbohydrates? There are two ways to count carbohydrates in food. You can use either of the methods or a combination of both. Reading "Nutrition Facts" on packaged food  The "Nutrition Facts" list is included on the labels of almost all packaged foods and beverages in the [place] It includes:  The serving size.  Information about nutrients in each serving, including the grams (g) of carbohydrate per serving. To use the "Nutrition Facts":  Decide how many servings you will have.  Multiply the number of servings by the number of carbohydrates per serving.  The resulting number is the total amount of carbohydrates that you will be having. Learning standard serving sizes of other foods  When you eat foods containing carbohydrates that are not packaged or do not include "Nutrition Facts" on the label, you need to measure the servings in order to count the amount of carbohydrates:  Measure the foods that you will eat with a food scale or measuring  cup, if needed.  Decide how many standard-size servings you will eat.  Multiply the number of servings by 15. Most carbohydrate-rich foods have about 15 g of carbohydrates per serving.  For example, if you eat 8 oz (170 g) of strawberries, you will have eaten 2 servings and 30 g of carbohydrates (2 servings x 15 g = 30 g).  For foods that have more than one food mixed, such as soups and casseroles, you must count the carbohydrates in each food that is included. The following list contains standard serving sizes of common carbohydrate-rich foods. Each of these servings has about 15 g of carbohydrates:   hamburger bun or  English muffin.   oz (15 mL) syrup.   oz (14 g) jelly.  1 slice of bread.  1 six-inch tortilla.  3 oz (85 g) cooked rice or pasta.  4 oz (113 g) cooked dried beans.  4 oz (113 g) starchy vegetable, such as peas, corn, or potatoes.  4 oz (113 g) hot cereal.  4 oz (113 g) mashed potatoes or  of a large baked potato.  4 oz (113 g) canned or frozen fruit.  4 oz (120 mL) fruit juice.  4-6 crackers.  6 chicken nuggets.  6 oz (170 g) unsweetened dry cereal.  6 oz (170 g) plain fat-free yogurt or yogurt sweetened with artificial sweeteners.  8 oz (240 mL) milk.  8 oz (170 g) fresh fruit or one small piece of fruit.  24 oz (680 g) popped popcorn. Example of carbohydrate counting Sample meal  3 oz (85 g) chicken breast.  6 oz (  170 g) brown rice.  4 oz (113 g) corn.  8 oz (240 mL) milk.  8 oz (170 g) strawberries with sugar-free whipped topping. Carbohydrate calculation 1. Identify the foods that contain carbohydrates:  Rice.  Corn.  Milk.  Strawberries. 2. Calculate how many servings you have of each food:  2 servings rice.  1 serving corn.  1 serving milk.  1 serving strawberries. 3. Multiply each number of servings by 15 g:  2 servings rice x 15 g = 30 g.  1 serving corn x 15 g = 15 g.  1 serving milk x 15 g = 15  g.  1 serving strawberries x 15 g = 15 g. 4. Add together all of the amounts to find the total grams of carbohydrates eaten:  30 g + 15 g + 15 g + 15 g = 75 g of carbohydrates total. This information is not intended to replace advice given to you by your health care provider. Make sure you discuss any questions you have with your health care provider. Document Released: 09/19/2005 Document Revised: 04/08/2016 Document Reviewed: 03/02/2016 Elsevier Interactive Patient Education  2017 [employer]  

## 2016-11-10 NOTE — Assessment & Plan Note (Signed)
Still not at goal Continue Zocor and Fish Oil Discussed starting Zetia, but she does not want to start 2 meds at the same time Discussed the importance of low fat diet and exercise for weight loss Will monitor for now

## 2016-11-14 ENCOUNTER — Telehealth: Payer: Self-pay

## 2016-11-14 MED ORDER — PEN NEEDLES 32G X 6 MM MISC: 18.0000 [IU] | Freq: Every day | 3 refills | Status: DC

## 2016-11-14 NOTE — Telephone Encounter (Signed)
Pt request rx for pen needles 32 G 6 mm to [place]. Advised pt done.

## 2016-11-21 ENCOUNTER — Encounter: Payer: Self-pay | Admitting: Internal Medicine

## 2016-11-28 ENCOUNTER — Other Ambulatory Visit: Payer: Self-pay | Admitting: Internal Medicine

## 2016-11-28 DIAGNOSIS — E7800 Pure hypercholesterolemia, unspecified: Secondary | ICD-10-CM

## 2016-11-28 DIAGNOSIS — E119 Type 2 diabetes mellitus without complications: Secondary | ICD-10-CM

## 2016-12-20 ENCOUNTER — Encounter: Payer: Self-pay | Admitting: Internal Medicine

## 2016-12-20 ENCOUNTER — Ambulatory Visit (INDEPENDENT_AMBULATORY_CARE_PROVIDER_SITE_OTHER): Payer: BLUE CROSS/BLUE SHIELD | Admitting: Internal Medicine

## 2016-12-20 VITALS — BP 128/80 | HR 70 | Temp 98.0°F | Wt 255.0 lb

## 2016-12-20 DIAGNOSIS — J0100 Acute maxillary sinusitis, unspecified: Secondary | ICD-10-CM | POA: Diagnosis not present

## 2016-12-20 MED ORDER — DOXYCYCLINE HYCLATE 100 MG PO TABS: 100.0000 mg | ORAL_TABLET | Freq: Two times a day (BID) | ORAL | 0 refills | Status: DC

## 2016-12-20 NOTE — Progress Notes (Signed)
HPI  Pt presents to the clinic today with c/o headache, facial pain and pressure, nasal congestion and cough. This started 5 days ago. She is blowing yellow mucous out of her nose. The cough is productive of yellow mucous. She denies fever, but has had chills and body aches. She has tried Mucinex and Nyquil with minimal relief. She has a history of seasonal allergies. She has also had sick contacts.  Review of Systems     Past Medical History:  Diagnosis Date  . Allergy   . Chicken pox   . Diabetes mellitus without complication ([place])   . Hyperlipidemia   . Hypertension   . Kidney stones     Family History  Problem Relation Age of Onset  . Arthritis Mother   . Cancer Mother   . Hyperlipidemia Mother   . Diabetes Mother   . Hyperlipidemia Father   . Arthritis Maternal Grandmother   . Diabetes Maternal Grandmother   . Arthritis Maternal Grandfather   . Hyperlipidemia Maternal Grandfather   . Heart disease Maternal Grandfather   . Stroke Maternal Grandfather   . Cancer Paternal Grandmother   . Diabetes Paternal Grandfather     Social History   Social History  . Marital status: Single    Spouse name: N/A  . Number of children: N/A  . Years of education: N/A   Occupational History  . Not on file.   Social History Main Topics  . Smoking status: Former Smoker    Years: 2.00    Types: Cigarettes  . Smokeless tobacco: Never Used  . Alcohol use Yes     Comment: occasional  . Drug use: No  . Sexual activity: Not on file   Other Topics Concern  . Not on file   Social History Narrative  . No narrative on file    Allergies  Allergen Reactions  . Metformin And Related Other (See Comments)    GI upset  . Morphine And Related Other (See Comments)    Migraine headaches     Constitutional: Positive headache, fatigue. Denies fever or  abrupt weight changes.  HEENT:  Positive facial pain, nasal congestion. Denies eye redness, ear pain, ringing in the ears, wax  buildup, runny nose or sore throat. Respiratory: Positive cough. Denies difficulty breathing or shortness of breath.  Cardiovascular: Denies chest pain, chest tightness, palpitations or swelling in the hands or feet.   No other specific complaints in a complete review of systems (except as listed in HPI above).  Objective:   BP 128/80   Pulse 70   Temp 98 F (36.7 C) (Oral)   Wt 255 lb (115.7 kg)   SpO2 98%   BMI 43.09 kg/m   General: Appears her stated age,  in NAD. HEENT: Head: normal shape and size, maxillary sinus tenderness noted;  Ears: Tm's gray and intact, normal light reflex; Nose: mucosa boggy and moist, septum midline; Throat/Mouth: + PND. Teeth present, mucosa erythematous and moist, no exudate noted, no lesions or ulcerations noted.  Neck:  No adenopathy noted.  Cardiovascular: Normal rate and rhythm. S1,S2 noted.  No murmur, rubs or gallops noted.  Pulmonary/Chest: Normal effort and positive vesicular breath sounds. No respiratory distress. No wheezes, rales or ronchi noted.       Assessment & Plan:   Acute bacterial sinusitis  Can use a Neti Pot which can be purchased from your local drug store. Flonase 2 sprays each nostril for 3 days and then as needed. eRx for Doxycycline  BID for 7 days  RTC as needed or if symptoms persist. Webb Silversmith, NP

## 2016-12-20 NOTE — Patient Instructions (Signed)

## 2016-12-27 ENCOUNTER — Other Ambulatory Visit: Payer: Self-pay | Admitting: Internal Medicine

## 2017-07-19 ENCOUNTER — Other Ambulatory Visit: Payer: Self-pay | Admitting: Internal Medicine

## 2017-07-19 DIAGNOSIS — E781 Pure hyperglyceridemia: Secondary | ICD-10-CM

## 2017-07-19 DIAGNOSIS — E119 Type 2 diabetes mellitus without complications: Secondary | ICD-10-CM

## 2017-07-20 NOTE — Telephone Encounter (Signed)
Lab overdue reminder letter mailed to pt

## 2017-09-14 ENCOUNTER — Ambulatory Visit: Payer: Self-pay | Admitting: Internal Medicine

## 2017-09-18 ENCOUNTER — Ambulatory Visit: Payer: BLUE CROSS/BLUE SHIELD | Admitting: Internal Medicine

## 2017-09-18 ENCOUNTER — Encounter: Payer: Self-pay | Admitting: Internal Medicine

## 2017-09-18 VITALS — BP 120/84 | HR 71 | Temp 98.2°F | Ht 64.5 in | Wt 244.1 lb

## 2017-09-18 DIAGNOSIS — E1141 Type 2 diabetes mellitus with diabetic mononeuropathy: Secondary | ICD-10-CM | POA: Diagnosis not present

## 2017-09-18 DIAGNOSIS — E119 Type 2 diabetes mellitus without complications: Secondary | ICD-10-CM

## 2017-09-18 DIAGNOSIS — Z23 Encounter for immunization: Secondary | ICD-10-CM | POA: Diagnosis not present

## 2017-09-18 DIAGNOSIS — E781 Pure hyperglyceridemia: Secondary | ICD-10-CM

## 2017-09-18 DIAGNOSIS — I10 Essential (primary) hypertension: Secondary | ICD-10-CM

## 2017-09-18 LAB — COMPREHENSIVE METABOLIC PANEL
ALBUMIN: 4.1 g/dL (ref 3.5–5.2)
ALK PHOS: 98 U/L (ref 39–117)
ALT: 9 U/L (ref 0–35)
AST: 12 U/L (ref 0–37)
BUN: 15 mg/dL (ref 6–23)
CALCIUM: 9.2 mg/dL (ref 8.4–10.5)
CHLORIDE: 101 meq/L (ref 96–112)
CO2: 29 mEq/L (ref 19–32)
Creatinine, Ser: 0.71 mg/dL (ref 0.40–1.20)
GFR: 90.72 mL/min (ref >60.00)
Glucose, Bld: 301 mg/dL — ABNORMAL HIGH (ref 70–99)
POTASSIUM: 4.7 meq/L (ref 3.5–5.1)
SODIUM: 137 meq/L (ref 135–145)
TOTAL PROTEIN: 7.2 g/dL (ref 6.0–8.3)
Total Bilirubin: 0.5 mg/dL (ref 0.2–1.2)

## 2017-09-18 LAB — LDL CHOLESTEROL, DIRECT: LDL DIRECT: 119 mg/dL

## 2017-09-18 LAB — LIPID PANEL
CHOL/HDL RATIO: 5
CHOLESTEROL: 172 mg/dL (ref 0–200)
HDL: 37.5 mg/dL — AB (ref >39.00)
NonHDL: 134.62
TRIGLYCERIDES: 248 mg/dL — AB (ref 0.0–149.0)
VLDL: 49.6 mg/dL — AB (ref 0.0–40.0)

## 2017-09-18 LAB — HEMOGLOBIN A1C: Hgb A1c MFr Bld: 11 % — ABNORMAL HIGH (ref 4.6–6.5)

## 2017-09-18 NOTE — Patient Instructions (Signed)

## 2017-09-18 NOTE — Progress Notes (Signed)
Subjective:    Patient ID: Tasha Leonard, female    DOB: 02-Sep-1961, 56 y.o.   MRN: [id number]  HPI  Pt presents to the clinic today for follow up of HTN, HLD and DM 2.  HTN: Her BP today is 120/84. She is taking Lisinopril HCT as prescribed. ECG from 03/2016 reviewed.  HLD: Her last LDL was 108, triglycerides 237, 11/2016. She denies myalgias on Simvastatin. She is not taking Fish Oil at this time. She does not consume a low fat diet.  DM 2: Her last A1C was 10.2%, 11/2016. She is not taking Levemir (because it was not effective in reducing her fasting sugars) but she is taking Glipizide as prescribed. Her sugars range 280-387. She does not consume a low carb diet but she has lost 10 lbs in the last 6 months. She checks her feet routinely and does report intermittent burning/ithcing. Flu 07/2016. Pneumovax 08/2014.  Review of Systems  Past Medical History:  Diagnosis Date  . Allergy   . Chicken pox   . Diabetes mellitus without complication ([place])   . Hyperlipidemia   . Hypertension   . Kidney stones     Current Outpatient Medications  Medication Sig Dispense Refill  . Blood Glucose Monitoring Suppl (ONE TOUCH ULTRA SYSTEM KIT) W/DEVICE KIT 1 kit by Does not apply route once. 1 each 0  . cyclobenzaprine (FLEXERIL) 10 MG tablet Take 1 tablet (10 mg total) by mouth daily as needed for muscle spasms. 15 tablet 1  . doxycycline (VIBRA-TABS) 100 MG tablet Take 1 tablet (100 mg total) by mouth 2 (two) times daily. 14 tablet 0  . glipiZIDE (GLUCOTROL) 10 MG tablet TAKE 1 TABLET BY MOUTH TWICE (2) DAILY BEFORE A MEAL 60 tablet 1  . glucose blood (ONE TOUCH TEST STRIPS) test strip 1 each by Other route 3 (three) times daily. 200 each 3  . ibuprofen (ADVIL,MOTRIN) 400 MG tablet Take 400 mg by mouth daily as needed.    . Insulin Detemir (LEVEMIR) 100 UNIT/ML Pen Inject 18 Units into the skin daily at 10 pm. (Patient taking differently: Inject 22 Units into the skin daily at 10 pm. ) 15 mL  11  . Insulin Pen Needle (PEN NEEDLES) 32G X 6 MM MISC Inject 18 Units into the skin daily at 10 pm. Use pen needle to administer Levemir insulin daily and as directed. Dx E11.9 100 each 3  . lisinopril-hydrochlorothiazide (PRINZIDE,ZESTORETIC) 10-12.5 MG tablet TAKE 1 TABLET BY MOUTH ONCE DAILY 30 tablet 1  . Loratadine (CLARITIN) 10 MG CAPS Take 1 capsule by mouth daily.    . Nepafenac (ILEVRO) 0.3 % SUSP Apply 1 drop to eye.    . Omega-3 Fatty Acids (FISH OIL PO) Take 1 capsule by mouth daily.    . ONE TOUCH ULTRA TEST test strip USE TO TEST 3 TIMES DAILY AS DIRECTED 200 each 3  . ONETOUCH DELICA LANCETS 42A MISC USE 3 TIMES DAILY AS DIRECTED 200 each 3  . simvastatin (ZOCOR) 20 MG tablet TAKE 1 TABLET BY MOUTH DAILY AT BEDTIME 30 tablet 5  . traMADol (ULTRAM) 50 MG tablet Take 1 tablet (50 mg total) by mouth 3 (three) times daily as needed. 15 tablet 0   No current facility-administered medications for this visit.     Allergies  Allergen Reactions  . Metformin And Related Other (See Comments)    GI upset  . Morphine And Related Other (See Comments)    Migraine headaches    Family  History  Problem Relation Age of Onset  . Arthritis Mother   . Cancer Mother   . Hyperlipidemia Mother   . Diabetes Mother   . Hyperlipidemia Father   . Arthritis Maternal Grandmother   . Diabetes Maternal Grandmother   . Arthritis Maternal Grandfather   . Hyperlipidemia Maternal Grandfather   . Heart disease Maternal Grandfather   . Stroke Maternal Grandfather   . Cancer Paternal Grandmother   . Diabetes Paternal Grandfather     Social History   Socioeconomic History  . Marital status: Single    Spouse name: Not on file  . Number of children: Not on file  . Years of education: Not on file  . Highest education level: Not on file  Social Needs  . Financial resource strain: Not on file  . Food insecurity - worry: Not on file  . Food insecurity - inability: Not on file  . Transportation  needs - medical: Not on file  . Transportation needs - non-medical: Not on file  Occupational History  . Not on file  Tobacco Use  . Smoking status: Former Smoker    Years: 2.00    Types: Cigarettes  . Smokeless tobacco: Never Used  Substance and Sexual Activity  . Alcohol use: Yes    Comment: occasional  . Drug use: No  . Sexual activity: Not on file  Other Topics Concern  . Not on file  Social History Narrative  . Not on file     Constitutional: Denies fever, malaise, fatigue, headache or abrupt weight changes.  Respiratory: Denies difficulty breathing, shortness of breath, cough or sputum production.   Cardiovascular: Denies chest pain, chest tightness, palpitations or swelling in the hands or feet.  Skin: Denies redness, rashes, lesions or ulcercations.  Neurological: Pt reports burning/oitching of feet. Denies dizziness, difficulty with memory, difficulty with speech or problems with balance and coordination.    No other specific complaints in a complete review of systems (except as listed in HPI above).     Objective:   Physical Exam   BP 120/84   Pulse 71   Temp 98.2 F (36.8 C) (Oral)   Ht 5' 4.5" (1.638 m)   Wt 244 lb 1.9 oz (110.7 kg)   SpO2 97%   BMI 41.26 kg/m  Wt Readings from Last 3 Encounters:  09/18/17 244 lb 1.9 oz (110.7 kg)  12/20/16 255 lb (115.7 kg)  11/10/16 256 lb (116.1 kg)    General: Appears her stated age, obese in NAD. Skin: Warm, dry and intact. No ulcerations noted. Cardiovascular: Normal rate and rhythm. S1,S2 noted.  No murmur, rubs or gallops noted. No JVD or BLE edema. No carotid bruits noted. Pulmonary/Chest: Normal effort and positive vesicular breath sounds. No respiratory distress. No wheezes, rales or ronchi noted.  Neurological: Alert and oriented. Sensation intact to BLE.   BMET    Component Value Date/Time   NA 137 11/07/2016 0849   K 4.5 11/07/2016 0849   K 3.8 06/30/2014 1546   CL 102 11/07/2016 0849   CO2 26  11/07/2016 0849   GLUCOSE 309 (H) 11/07/2016 0849   BUN 16 11/07/2016 0849   CREATININE 0.79 11/07/2016 0849   CALCIUM 9.2 11/07/2016 0849   GFRNONAA >60 03/11/2016 1444   GFRAA >60 03/11/2016 1444    Lipid Panel     Component Value Date/Time   CHOL 191 11/07/2016 0849   TRIG 237.0 (H) 11/07/2016 0849   HDL 40.40 11/07/2016 0849   CHOLHDL 5  11/07/2016 0849   VLDL 47.4 (H) 11/07/2016 0849   LDLCALC 115 (H) 02/03/2014 0932    CBC    Component Value Date/Time   WBC 8.1 11/07/2016 0849   RBC 4.74 11/07/2016 0849   HGB 14.2 11/07/2016 0849   HCT 41.0 11/07/2016 0849   PLT 250.0 11/07/2016 0849   MCV 86.5 11/07/2016 0849   MCH 29.9 03/11/2016 1444   MCHC 34.6 11/07/2016 0849   RDW 13.1 11/07/2016 0849    Hgb A1C Lab Results  Component Value Date   HGBA1C 10.2 (H) 11/07/2016           Assessment & Plan:

## 2017-09-18 NOTE — Assessment & Plan Note (Signed)
Controlled on Lisinopril HCT CBC and CMET today Will monitor

## 2017-09-18 NOTE — Assessment & Plan Note (Signed)
Uncontrolled, noncompliant with diet and exercise A1C today No microalbumin secondary to ACEI therapy Encouraged her to consume a low fat, low carb diet and exercise for weight loss Continue Glipizide for now (failed Metformin and Levemir) Foot exam today Encouraged yearly eye exams Flu shot today Pneumovax UTD

## 2017-09-18 NOTE — Addendum Note (Signed)
Addended by: Jacqualin Combes on: 09/18/2017 12:51 PM   Modules accepted: Orders

## 2017-09-18 NOTE — Assessment & Plan Note (Signed)
CMET and lipid profile today Encouraged her to consume a low fat diet Continue Simvastatin for now, she is not taking her Fish Oil

## 2017-09-22 MED ORDER — GLIPIZIDE 10 MG PO TABS: ORAL_TABLET | ORAL | 3 refills | Status: AC

## 2017-09-22 MED ORDER — SIMVASTATIN 40 MG PO TABS: 40.0000 mg | ORAL_TABLET | Freq: Every day | ORAL | 5 refills | Status: DC

## 2017-09-22 MED ORDER — SITAGLIPTIN PHOSPHATE 100 MG PO TABS: 100.0000 mg | ORAL_TABLET | Freq: Every day | ORAL | 2 refills | Status: DC

## 2017-09-22 MED ORDER — LISINOPRIL-HYDROCHLOROTHIAZIDE 10-12.5 MG PO TABS: 1.0000 | ORAL_TABLET | Freq: Every day | ORAL | 5 refills | Status: DC

## 2017-09-22 NOTE — Addendum Note (Signed)
Addended by: Lurlean Nanny on: 09/22/2017 05:48 PM   Modules accepted: Orders

## 2017-09-23 ENCOUNTER — Other Ambulatory Visit: Payer: Self-pay | Admitting: Internal Medicine

## 2017-09-23 DIAGNOSIS — E7800 Pure hypercholesterolemia, unspecified: Secondary | ICD-10-CM

## 2017-09-24 ENCOUNTER — Other Ambulatory Visit: Payer: Self-pay | Admitting: Internal Medicine

## 2017-09-24 DIAGNOSIS — E1165 Type 2 diabetes mellitus with hyperglycemia: Secondary | ICD-10-CM

## 2017-11-20 ENCOUNTER — Other Ambulatory Visit: Payer: Self-pay | Admitting: Internal Medicine

## 2017-12-06 ENCOUNTER — Other Ambulatory Visit: Payer: Self-pay | Admitting: Internal Medicine

## 2017-12-06 MED ORDER — TRAMADOL HCL 50 MG PO TABS: 50.0000 mg | ORAL_TABLET | Freq: Three times a day (TID) | ORAL | 0 refills | Status: DC | PRN

## 2017-12-06 NOTE — Telephone Encounter (Signed)
Requesting refill tramadol; last refilled # 15 on 11/07/16 Last seen 09/18/17.Please advise.

## 2017-12-18 DIAGNOSIS — E1159 Type 2 diabetes mellitus with other circulatory complications: Secondary | ICD-10-CM | POA: Diagnosis not present

## 2017-12-18 DIAGNOSIS — E1169 Type 2 diabetes mellitus with other specified complication: Secondary | ICD-10-CM | POA: Diagnosis not present

## 2017-12-18 DIAGNOSIS — E1165 Type 2 diabetes mellitus with hyperglycemia: Secondary | ICD-10-CM | POA: Diagnosis not present

## 2017-12-18 DIAGNOSIS — E785 Hyperlipidemia, unspecified: Secondary | ICD-10-CM | POA: Diagnosis not present

## 2017-12-28 ENCOUNTER — Encounter: Payer: Self-pay | Admitting: Internal Medicine

## 2018-01-05 ENCOUNTER — Encounter: Payer: Self-pay | Admitting: Internal Medicine

## 2018-01-05 ENCOUNTER — Ambulatory Visit (INDEPENDENT_AMBULATORY_CARE_PROVIDER_SITE_OTHER): Payer: BLUE CROSS/BLUE SHIELD | Admitting: Internal Medicine

## 2018-01-05 VITALS — BP 122/84 | HR 78 | Temp 98.3°F | Ht 64.5 in | Wt 242.0 lb

## 2018-01-05 DIAGNOSIS — E559 Vitamin D deficiency, unspecified: Secondary | ICD-10-CM | POA: Diagnosis not present

## 2018-01-05 DIAGNOSIS — Z114 Encounter for screening for human immunodeficiency virus [HIV]: Secondary | ICD-10-CM

## 2018-01-05 DIAGNOSIS — E119 Type 2 diabetes mellitus without complications: Secondary | ICD-10-CM | POA: Diagnosis not present

## 2018-01-05 DIAGNOSIS — I10 Essential (primary) hypertension: Secondary | ICD-10-CM | POA: Diagnosis not present

## 2018-01-05 DIAGNOSIS — Z794 Long term (current) use of insulin: Secondary | ICD-10-CM

## 2018-01-05 DIAGNOSIS — K602 Anal fissure, unspecified: Secondary | ICD-10-CM | POA: Diagnosis not present

## 2018-01-05 DIAGNOSIS — Z0001 Encounter for general adult medical examination with abnormal findings: Secondary | ICD-10-CM

## 2018-01-05 DIAGNOSIS — E7800 Pure hypercholesterolemia, unspecified: Secondary | ICD-10-CM | POA: Diagnosis not present

## 2018-01-05 DIAGNOSIS — Z1159 Encounter for screening for other viral diseases: Secondary | ICD-10-CM | POA: Diagnosis not present

## 2018-01-05 MED ORDER — NITROGLYCERIN 0.4 % RE OINT: 1.0000 "application " | TOPICAL_OINTMENT | Freq: Two times a day (BID) | RECTAL | 0 refills | Status: DC

## 2018-01-05 NOTE — Patient Instructions (Signed)
Health Maintenance for Postmenopausal Women Menopause is a normal process in which your reproductive ability comes to an end. This process happens gradually over a span of months to years, usually between the ages of 22 and 9. Menopause is complete when you have missed 12 consecutive menstrual periods. It is important to talk with your health care provider about some of the most common conditions that affect postmenopausal women, such as heart disease, cancer, and bone loss (osteoporosis). Adopting a healthy lifestyle and getting preventive care can help to promote your health and wellness. Those actions can also lower your chances of developing some of these common conditions. What should I know about menopause? During menopause, you may experience a number of symptoms, such as:  Moderate-to-severe hot flashes.  Night sweats.  Decrease in sex drive.  Mood swings.  Headaches.  Tiredness.  Irritability.  Memory problems.  Insomnia.  Choosing to treat or not to treat menopausal changes is an individual decision that you make with your health care provider. What should I know about hormone replacement therapy and supplements? Hormone therapy products are effective for treating symptoms that are associated with menopause, such as hot flashes and night sweats. Hormone replacement carries certain risks, especially as you become older. If you are thinking about using estrogen or estrogen with progestin treatments, discuss the benefits and risks with your health care provider. What should I know about heart disease and stroke? Heart disease, heart attack, and stroke become more likely as you age. This may be due, in part, to the hormonal changes that your body experiences during menopause. These can affect how your body processes dietary fats, triglycerides, and cholesterol. Heart attack and stroke are both medical emergencies. There are many things that you can do to help prevent heart disease  and stroke:  Have your blood pressure checked at least every 1-2 years. High blood pressure causes heart disease and increases the risk of stroke.  If you are 56-22 years old, ask your health care provider if you should take aspirin to prevent a heart attack or a stroke.  Do not use any tobacco products, including cigarettes, chewing tobacco, or electronic cigarettes. If you need help quitting, ask your health care provider.  It is important to eat a healthy diet and maintain a healthy weight. ? Be sure to include plenty of vegetables, fruits, low-fat dairy products, and lean protein. ? Avoid eating foods that are high in solid fats, added sugars, or salt (sodium).  Get regular exercise. This is one of the most important things that you can do for your health. ? Try to exercise for at least 150 minutes each week. The type of exercise that you do should increase your heart rate and make you sweat. This is known as moderate-intensity exercise. ? Try to do strengthening exercises at least twice each week. Do these in addition to the moderate-intensity exercise.  Know your numbers.Ask your health care provider to check your cholesterol and your blood glucose. Continue to have your blood tested as directed by your health care provider.  What should I know about cancer screening? There are several types of cancer. Take the following steps to reduce your risk and to catch any cancer development as early as possible. Breast Cancer  Practice breast self-awareness. ? This means understanding how your breasts normally appear and feel. ? It also means doing regular breast self-exams. Let your health care provider know about any changes, no matter how small.  If you are 40  or older, have a clinician do a breast exam (clinical breast exam or CBE) every year. Depending on your age, family history, and medical history, it may be recommended that you also have a yearly breast X-ray (mammogram).  If you  have a family history of breast cancer, talk with your health care provider about genetic screening.  If you are at high risk for breast cancer, talk with your health care provider about having an MRI and a mammogram every year.  Breast cancer (BRCA) gene test is recommended for women who have family members with BRCA-related cancers. Results of the assessment will determine the need for genetic counseling and BRCA1 and for BRCA2 testing. BRCA-related cancers include these types: ? Breast. This occurs in males or females. ? Ovarian. ? Tubal. This may also be called fallopian tube cancer. ? Cancer of the abdominal or pelvic lining (peritoneal cancer). ? Prostate. ? Pancreatic.  Cervical, Uterine, and Ovarian Cancer Your health care provider may recommend that you be screened regularly for cancer of the pelvic organs. These include your ovaries, uterus, and vagina. This screening involves a pelvic exam, which includes checking for microscopic changes to the surface of your cervix (Pap test).  For women ages 21-65, health care providers may recommend a pelvic exam and a Pap test every three years. For women ages 79-65, they may recommend the Pap test and pelvic exam, combined with testing for human papilloma virus (HPV), every five years. Some types of HPV increase your risk of cervical cancer. Testing for HPV may also be done on women of any age who have unclear Pap test results.  Other health care providers may not recommend any screening for nonpregnant women who are considered low risk for pelvic cancer and have no symptoms. Ask your health care provider if a screening pelvic exam is right for you.  If you have had past treatment for cervical cancer or a condition that could lead to cancer, you need Pap tests and screening for cancer for at least 20 years after your treatment. If Pap tests have been discontinued for you, your risk factors (such as having a new sexual partner) need to be  reassessed to determine if you should start having screenings again. Some women have medical problems that increase the chance of getting cervical cancer. In these cases, your health care provider may recommend that you have screening and Pap tests more often.  If you have a family history of uterine cancer or ovarian cancer, talk with your health care provider about genetic screening.  If you have vaginal bleeding after reaching menopause, tell your health care provider.  There are currently no reliable tests available to screen for ovarian cancer.  Lung Cancer Lung cancer screening is recommended for adults 69-62 years old who are at high risk for lung cancer because of a history of smoking. A yearly low-dose CT scan of the lungs is recommended if you:  Currently smoke.  Have a history of at least 30 pack-years of smoking and you currently smoke or have quit within the past 15 years. A pack-year is smoking an average of one pack of cigarettes per day for one year.  Yearly screening should:  Continue until it has been 15 years since you quit.  Stop if you develop a health problem that would prevent you from having lung cancer treatment.  Colorectal Cancer  This type of cancer can be detected and can often be prevented.  Routine colorectal cancer screening usually begins at  age 42 and continues through age 45.  If you have risk factors for colon cancer, your health care provider may recommend that you be screened at an earlier age.  If you have a family history of colorectal cancer, talk with your health care provider about genetic screening.  Your health care provider may also recommend using home test kits to check for hidden blood in your stool.  A small camera at the end of a tube can be used to examine your colon directly (sigmoidoscopy or colonoscopy). This is done to check for the earliest forms of colorectal cancer.  Direct examination of the colon should be repeated every  5-10 years until age 71. However, if early forms of precancerous polyps or small growths are found or if you have a family history or genetic risk for colorectal cancer, you may need to be screened more often.  Skin Cancer  Check your skin from head to toe regularly.  Monitor any moles. Be sure to tell your health care provider: ? About any new moles or changes in moles, especially if there is a change in a mole's shape or color. ? If you have a mole that is larger than the size of a pencil eraser.  If any of your family members has a history of skin cancer, especially at a young age, talk with your health care provider about genetic screening.  Always use sunscreen. Apply sunscreen liberally and repeatedly throughout the day.  Whenever you are outside, protect yourself by wearing long sleeves, pants, a wide-brimmed hat, and sunglasses.  What should I know about osteoporosis? Osteoporosis is a condition in which bone destruction happens more quickly than new bone creation. After menopause, you may be at an increased risk for osteoporosis. To help prevent osteoporosis or the bone fractures that can happen because of osteoporosis, the following is recommended:  If you are 46-71 years old, get at least 1,000 mg of calcium and at least 600 mg of vitamin D per day.  If you are older than age 55 but younger than age 65, get at least 1,200 mg of calcium and at least 600 mg of vitamin D per day.  If you are older than age 54, get at least 1,200 mg of calcium and at least 800 mg of vitamin D per day.  Smoking and excessive alcohol intake increase the risk of osteoporosis. Eat foods that are rich in calcium and vitamin D, and do weight-bearing exercises several times each week as directed by your health care provider. What should I know about how menopause affects my mental health? Depression may occur at any age, but it is more common as you become older. Common symptoms of depression  include:  Low or sad mood.  Changes in sleep patterns.  Changes in appetite or eating patterns.  Feeling an overall lack of motivation or enjoyment of activities that you previously enjoyed.  Frequent crying spells.  Talk with your health care provider if you think that you are experiencing depression. What should I know about immunizations? It is important that you get and maintain your immunizations. These include:  Tetanus, diphtheria, and pertussis (Tdap) booster vaccine.  Influenza every year before the flu season begins.  Pneumonia vaccine.  Shingles vaccine.  Your health care provider may also recommend other immunizations. This information is not intended to replace advice given to you by your health care provider. Make sure you discuss any questions you have with your health care provider. Document Released: 11/11/2005  Document Revised: 04/08/2016 Document Reviewed: 06/23/2015 Elsevier Interactive Patient Education  2018 [employer]  

## 2018-01-07 LAB — COMPREHENSIVE METABOLIC PANEL
AG Ratio: 1.6 (calc) (ref 1.0–2.5)
ALBUMIN MSPROF: 4.5 g/dL (ref 3.6–5.1)
ALKALINE PHOSPHATASE (APISO): 91 U/L (ref 33–130)
ALT: 8 U/L (ref 6–29)
AST: 13 U/L (ref 10–35)
BILIRUBIN TOTAL: 0.4 mg/dL (ref 0.2–1.2)
BUN: 10 mg/dL (ref 7–25)
CALCIUM: 9.9 mg/dL (ref 8.6–10.4)
CHLORIDE: 100 mmol/L (ref 98–110)
CO2: 28 mmol/L (ref 20–32)
Creat: 0.75 mg/dL (ref 0.50–1.05)
Globulin: 2.9 g/dL (calc) (ref 1.9–3.7)
Glucose, Bld: 199 mg/dL — ABNORMAL HIGH (ref 65–99)
Potassium: 4.7 mmol/L (ref 3.5–5.3)
Sodium: 138 mmol/L (ref 135–146)
Total Protein: 7.4 g/dL (ref 6.1–8.1)

## 2018-01-07 LAB — HEMOGLOBIN A1C
EAG (MMOL/L): 12.8 (calc)
Hgb A1c MFr Bld: 9.7 % of total Hgb — ABNORMAL HIGH (ref <5.7)
MEAN PLASMA GLUCOSE: 232 (calc)

## 2018-01-07 LAB — LIPID PANEL
CHOLESTEROL: 184 mg/dL (ref <200)
HDL: 41 mg/dL — AB (ref >50)
LDL Cholesterol (Calc): 107 mg/dL (calc) — ABNORMAL HIGH
Non-HDL Cholesterol (Calc): 143 mg/dL (calc) — ABNORMAL HIGH (ref <130)
Total CHOL/HDL Ratio: 4.5 (calc) (ref <5.0)
Triglycerides: 239 mg/dL — ABNORMAL HIGH (ref <150)

## 2018-01-07 LAB — CBC
HEMATOCRIT: 42.2 % (ref 35.0–45.0)
Hemoglobin: 14.9 g/dL (ref 11.7–15.5)
MCH: 29.7 pg (ref 27.0–33.0)
MCHC: 35.3 g/dL (ref 32.0–36.0)
MCV: 84.1 fL (ref 80.0–100.0)
MPV: 11.1 fL (ref 7.5–12.5)
Platelets: 304 10*3/uL (ref 140–400)
RBC: 5.02 10*6/uL (ref 3.80–5.10)
RDW: 12.9 % (ref 11.0–15.0)
WBC: 10.6 10*3/uL (ref 3.8–10.8)

## 2018-01-07 LAB — HEPATITIS C ANTIBODY
HEP C AB: NONREACTIVE
SIGNAL TO CUT-OFF: 0.01 (ref <1.00)

## 2018-01-07 LAB — VITAMIN D 25 HYDROXY (VIT D DEFICIENCY, FRACTURES): VIT D 25 HYDROXY: 12 ng/mL — AB (ref 30–100)

## 2018-01-07 LAB — HIV ANTIBODY (ROUTINE TESTING W REFLEX): HIV 1&2 Ab, 4th Generation: NONREACTIVE

## 2018-01-08 ENCOUNTER — Other Ambulatory Visit: Payer: Self-pay | Admitting: Internal Medicine

## 2018-01-08 MED ORDER — VITAMIN D (ERGOCALCIFEROL) 1.25 MG (50000 UNIT) PO CAPS: 50000.0000 [IU] | ORAL_CAPSULE | ORAL | 0 refills | Status: DC

## 2018-01-09 ENCOUNTER — Encounter: Payer: Self-pay | Admitting: Internal Medicine

## 2018-01-09 NOTE — Assessment & Plan Note (Signed)
Controlled on Lisinopril HCT CBC and CMET reviewed Reinforced DASH diet and exercise for weight loss

## 2018-01-09 NOTE — Progress Notes (Signed)
Subjective:    Patient ID: Tasha Leonard, female    DOB: [birth year], 57 y.o.   MRN: [id number]  HPI  Pt presents to the clinic today for her annual exam. She is also due to follow up chronic conditions.  DM 2: Her last A1C was 11%, 09/2017. Her sugars range 206-268. She is taking [place], Januvia, and Ozympic as prescribed. She checks her feet daily. Her last eye exam was 04/2017. She does follow with endocrinology.  HLD: Her last LDL was 119, triglycerides 248, 09/2017. She is taking Simvastatin and Fish Oil as prescribed. She does reports some generalized aches and pains that she is attributing to her cholesterol medication. She reports she did not have this issue when she was on 20 mg as opposed to 40 mg. She does not consume a low fat diet.  HTN: Her BP today is 122/84. She is taking Lisinopril HCT as prescribed. ECG from 03/2016 reviewed.  She also c/o anal itching. This has been going on a few weeks. She denies constipation, diarrhea or blood in her stool. She denies abdominal pain, change in diet. She denies reflux, nausea or vomiting. She has not tried anything OTC for her symptoms.  Flu: 09/2017  Tetanus: 11/2016 Pneumovax: 08/2014 Pap Smear: 02/2015 Mammogram: 03/2015 Colon Screening: 03/2015 Vision Screening: annually Dentist: biannually  Diet: She does eat meat. She consumes fruits and veggies daily. She does eat fried foods. She drinks mostly water, tea. Exercise: None  Review of Systems      Past Medical History:  Diagnosis Date  . Allergy   . Chicken pox   . Diabetes mellitus without complication ([place])   . Hyperlipidemia   . Hypertension   . Kidney stones     Current Outpatient Medications  Medication Sig Dispense Refill  . Blood Glucose Monitoring Suppl (ONE TOUCH ULTRA SYSTEM KIT) W/DEVICE KIT 1 kit by Does not apply route once. 1 each 0  . cyclobenzaprine (FLEXERIL) 10 MG tablet Take 1 tablet (10 mg total) by mouth daily as needed for muscle spasms.  15 tablet 1  . glipiZIDE (GLUCOTROL) 10 MG tablet TAKE 1 TABLET BY MOUTH TWICE (2) DAILY BEFORE A MEAL 60 tablet 3  . glucose blood (ONE TOUCH TEST STRIPS) test strip 1 each by Other route 3 (three) times daily. 200 each 3  . ibuprofen (ADVIL,MOTRIN) 400 MG tablet Take 400 mg by mouth daily as needed.    Marland Kitchen JANUVIA 100 MG tablet TAKE 1 TABLET BY MOUTH ONCE DAILY 30 tablet 2  . lisinopril-hydrochlorothiazide (PRINZIDE,ZESTORETIC) 10-12.5 MG tablet Take 1 tablet by mouth daily. 30 tablet 5  . Loratadine (CLARITIN) 10 MG CAPS Take 1 capsule by mouth daily.    . Omega-3 Fatty Acids (FISH OIL PO) Take 1 capsule by mouth daily.    . ONE TOUCH ULTRA TEST test strip USE TO TEST 3 TIMES DAILY AS DIRECTED 200 each 3  . ONETOUCH DELICA LANCETS 97L MISC USE 3 TIMES DAILY AS DIRECTED 200 each 3  . OZEMPIC 0.25 or 0.5 MG/DOSE SOPN     . simvastatin (ZOCOR) 40 MG tablet Take 1 tablet (40 mg total) by mouth at bedtime. 30 tablet 5  . traMADol (ULTRAM) 50 MG tablet Take 1 tablet (50 mg total) by mouth every 8 (eight) hours as needed. 30 tablet 0  . Nitroglycerin 0.4 % OINT Place 1 application rectally 2 (two) times daily. 30 g 0  . Vitamin D, Ergocalciferol, (DRISDOL) 50000 units CAPS capsule Take 1 capsule (  50,000 Units total) by mouth every 7 (seven) days. 12 capsule 0   No current facility-administered medications for this visit.     Allergies  Allergen Reactions  . Metformin And Related Other (See Comments)    GI upset  . Morphine And Related Other (See Comments)    Migraine headaches    Family History  Problem Relation Age of Onset  . Arthritis Mother   . Cancer Mother   . Hyperlipidemia Mother   . Diabetes Mother   . Hyperlipidemia Father   . Arthritis Maternal Grandmother   . Diabetes Maternal Grandmother   . Arthritis Maternal Grandfather   . Hyperlipidemia Maternal Grandfather   . Heart disease Maternal Grandfather   . Stroke Maternal Grandfather   . Cancer Paternal Grandmother   .  Diabetes Paternal Grandfather     Social History   Socioeconomic History  . Marital status: Single    Spouse name: Not on file  . Number of children: Not on file  . Years of education: Not on file  . Highest education level: Not on file  Occupational History  . Not on file  Social Needs  . Financial resource strain: Not on file  . Food insecurity:    Worry: Not on file    Inability: Not on file  . Transportation needs:    Medical: Not on file    Non-medical: Not on file  Tobacco Use  . Smoking status: Former Smoker    Years: 2.00    Types: Cigarettes  . Smokeless tobacco: Never Used  Substance and Sexual Activity  . Alcohol use: Yes    Comment: occasional  . Drug use: No  . Sexual activity: Not on file  Lifestyle  . Physical activity:    Days per week: Not on file    Minutes per session: Not on file  . Stress: Not on file  Relationships  . Social connections:    Talks on phone: Not on file    Gets together: Not on file    Attends religious service: Not on file    Active member of club or organization: Not on file    Attends meetings of clubs or organizations: Not on file    Relationship status: Not on file  . Intimate partner violence:    Fear of current or ex partner: Not on file    Emotionally abused: Not on file    Physically abused: Not on file    Forced sexual activity: Not on file  Other Topics Concern  . Not on file  Social History Narrative  . Not on file     Constitutional: Denies fever, malaise, fatigue, headache or abrupt weight changes.  HEENT: Denies eye pain, eye redness, ear pain, ringing in the ears, wax buildup, runny nose, nasal congestion, bloody nose, or sore throat. Respiratory: Denies difficulty breathing, shortness of breath, cough or sputum production.   Cardiovascular: Denies chest pain, chest tightness, palpitations or swelling in the hands or feet.  Gastrointestinal: Pt reports anal itching. Denies abdominal pain, bloating,  constipation, diarrhea or blood in the stool.  GU: Denies urgency, frequency, pain with urination, burning sensation, blood in urine, odor or discharge. Musculoskeletal: Denies decrease in range of motion, difficulty with gait, muscle pain or joint pain and swelling.  Skin: Denies redness, rashes, lesions or ulcercations.  Neurological: Denies dizziness, difficulty with memory, difficulty with speech or problems with balance and coordination.  Psych: Denies anxiety, depression, SI/HI.  No other specific complaints in  a complete review of systems (except as listed in HPI above).  Objective:   Physical Exam    BP 122/84 (BP Location: Left Arm, Patient Position: Sitting, Cuff Size: Large)   Pulse 78   Temp 98.3 F (36.8 C) (Oral)   Ht 5' 4.5" (1.638 m)   Wt 242 lb (109.8 kg)   SpO2 97%   BMI 40.90 kg/m  Wt Readings from Last 3 Encounters:  01/05/18 242 lb (109.8 kg)  09/18/17 244 lb 1.9 oz (110.7 kg)  12/20/16 255 lb (115.7 kg)    General: Appears her stated age, obese in NAD. Skin: Warm, dry and intact. No ulcerations noted. HEENT: Head: normal shape and size; Eyes: sclera white, no icterus, conjunctiva pink, PERRLA and EOMs intact; Ears: Tm's gray and intact, normal light reflex; Throat/Mouth: Teeth present, mucosa pink and moist, no exudate, lesions or ulcerations noted.  Neck:  Neck supple, trachea midline. No masses, lumps or thyromegaly present.  Cardiovascular: Normal rate and rhythm. S1,S2 noted.  No murmur, rubs or gallops noted. No JVD or BLE edema. No carotid bruits noted. Pulmonary/Chest: Normal effort and positive vesicular breath sounds. No respiratory distress. No wheezes, rales or ronchi noted.  Abdomen: Soft and nontender. Normal bowel sounds. No distention or masses noted. Liver, spleen and kidneys non palpable. Rectal: Multiple tiny non bleeding fissures noted of external anus. Musculoskeletal: Strength 5/5 BUE/BLE.Marland Kitchen No difficulty with gait.  Neurological: Alert  and oriented. Cranial nerves II-XII grossly intact. Coordination normal.  Psychiatric: Mood and affect normal. Behavior is normal. Judgment and thought content normal.    BMET    Component Value Date/Time   NA 138 01/05/2018 1545   K 4.7 01/05/2018 1545   K 3.8 06/30/2014 1546   CL 100 01/05/2018 1545   CO2 28 01/05/2018 1545   GLUCOSE 199 (H) 01/05/2018 1545   BUN 10 01/05/2018 1545   CREATININE 0.75 01/05/2018 1545   CALCIUM 9.9 01/05/2018 1545   GFRNONAA >60 03/11/2016 1444   GFRAA >60 03/11/2016 1444    Lipid Panel     Component Value Date/Time   CHOL 184 01/05/2018 1545   TRIG 239 (H) 01/05/2018 1545   HDL 41 (L) 01/05/2018 1545   CHOLHDL 4.5 01/05/2018 1545   VLDL 49.6 (H) 09/18/2017 0843   LDLCALC 107 (H) 01/05/2018 1545    CBC    Component Value Date/Time   WBC 10.6 01/05/2018 1545   RBC 5.02 01/05/2018 1545   HGB 14.9 01/05/2018 1545   HCT 42.2 01/05/2018 1545   PLT 304 01/05/2018 1545   MCV 84.1 01/05/2018 1545   MCH 29.7 01/05/2018 1545   MCHC 35.3 01/05/2018 1545   RDW 12.9 01/05/2018 1545    Hgb A1C Lab Results  Component Value Date   HGBA1C 9.7 (H) 01/05/2018          Assessment & Plan:   Preventative Health Maintenance:  Encouraged her to get a flu shot in the fall Tetanus and pneumovax UTD Pap smear UTD She will call and schedule her mammogram Colon screening UTD Encouraged her to consume a balanced diet and exercise regimen Advised her to see an eye doctor and dentist annually Will check CBC, CMET, Lipid, A1C and Vit D today  Anal Fissure:  eRx for Nitroglycerin ointment- use as directed Drink plenty of water, avoid straining  RTC in 1 year, sooner if needed Webb Silversmith, NP

## 2018-01-09 NOTE — Assessment & Plan Note (Signed)
Uncontrolled A1C today No microalbumin secondary to ACEI therapy Encouraged her to consume a low carb diet and exercise for weight loss Continue current medications Encouraged yearly eye exams Foot exam today Follow up with endocrinology as scheduled.

## 2018-01-09 NOTE — Assessment & Plan Note (Signed)
CMET and Lipid profile today Encouraged herto consume a low fat diet Continue Simvastatin for now, will adjust if needed based on labs 

## 2018-01-16 ENCOUNTER — Encounter: Payer: Self-pay | Admitting: Internal Medicine

## 2018-01-16 MED ORDER — EZETIMIBE 10 MG PO TABS: 10.0000 mg | ORAL_TABLET | Freq: Every day | ORAL | 2 refills | Status: DC

## 2018-02-03 IMAGING — CR DG CHEST 2V
2 series · 2 of 2 positions shown · non-contrast
Comparison: None.

CLINICAL DATA: Chest pain.

EXAM:
CHEST  2 VIEW

[chest pa]
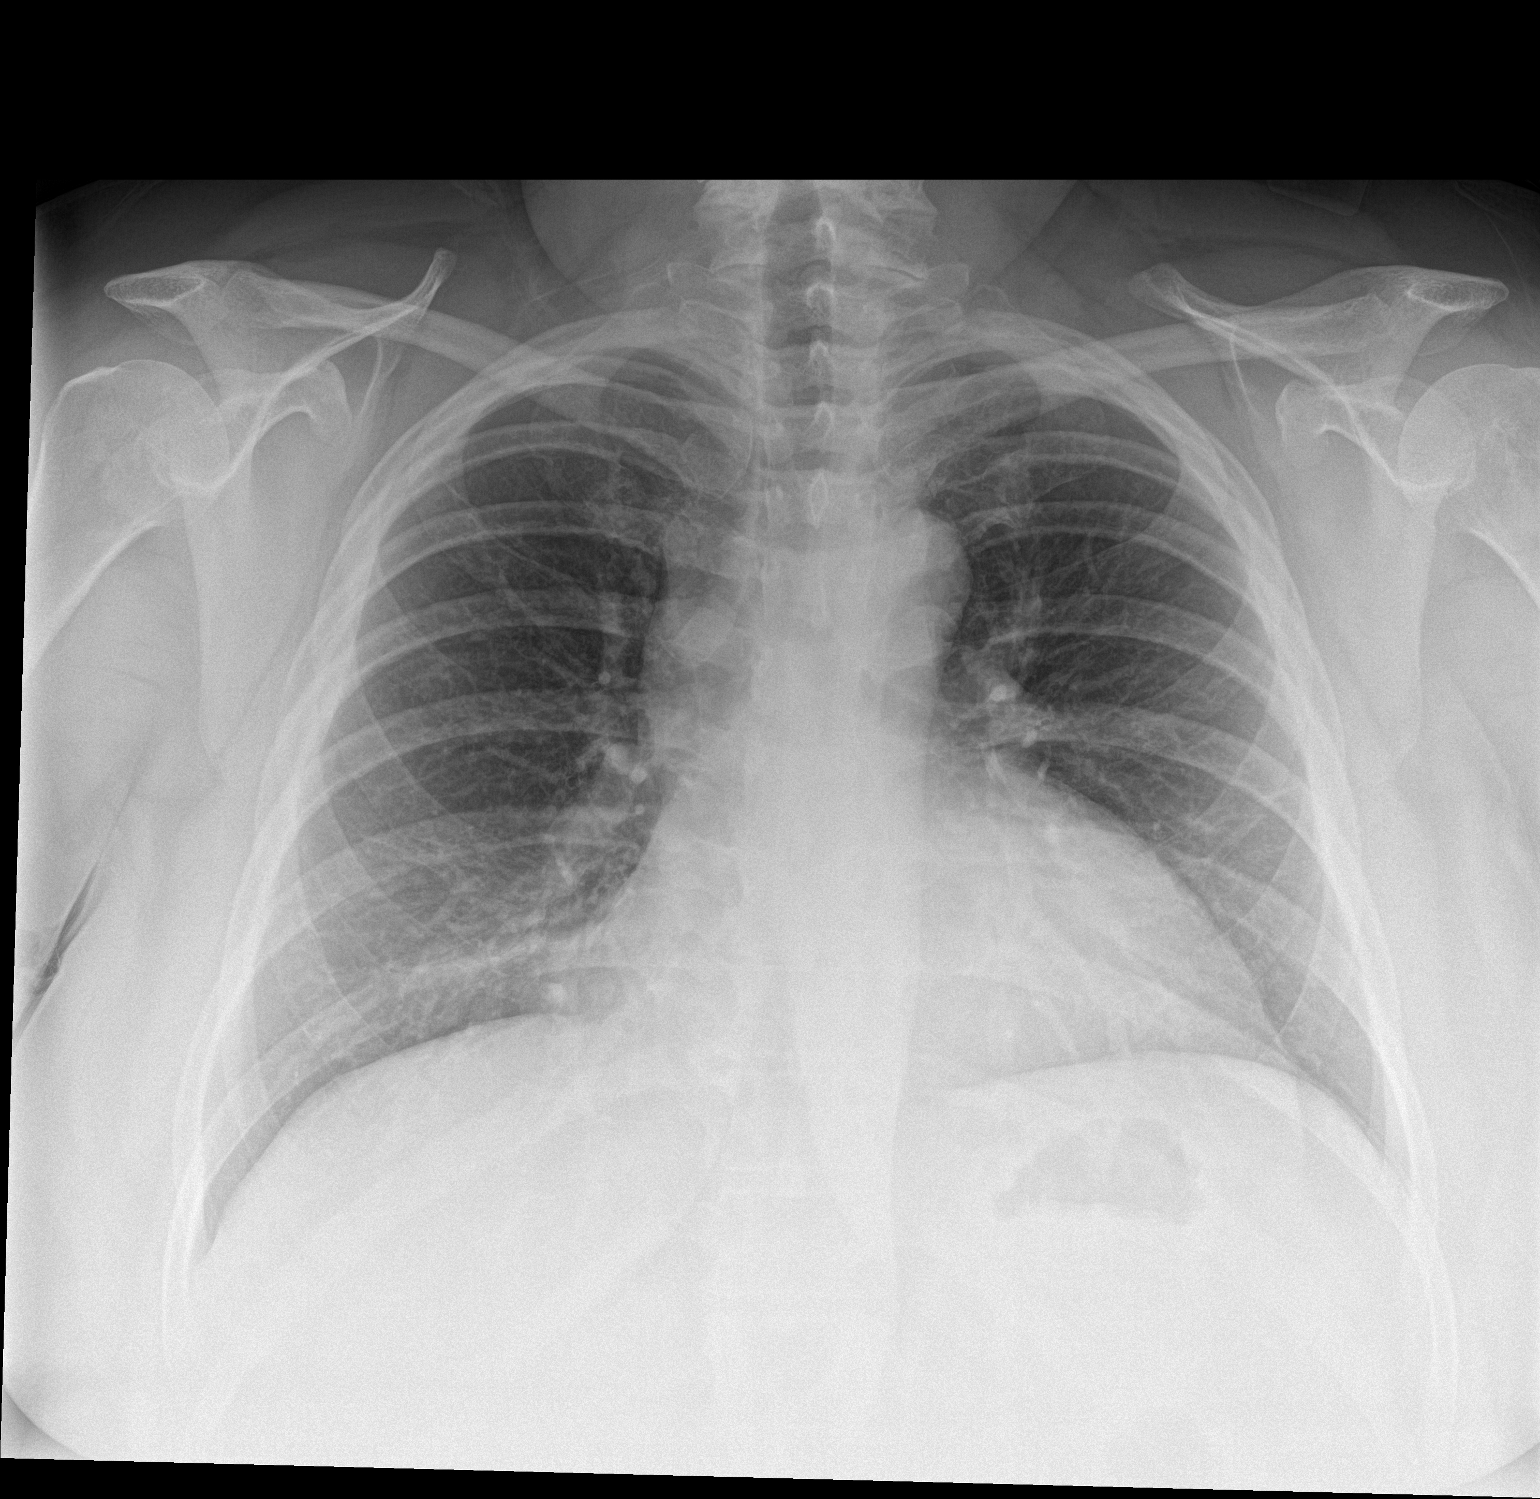

[chest lat]
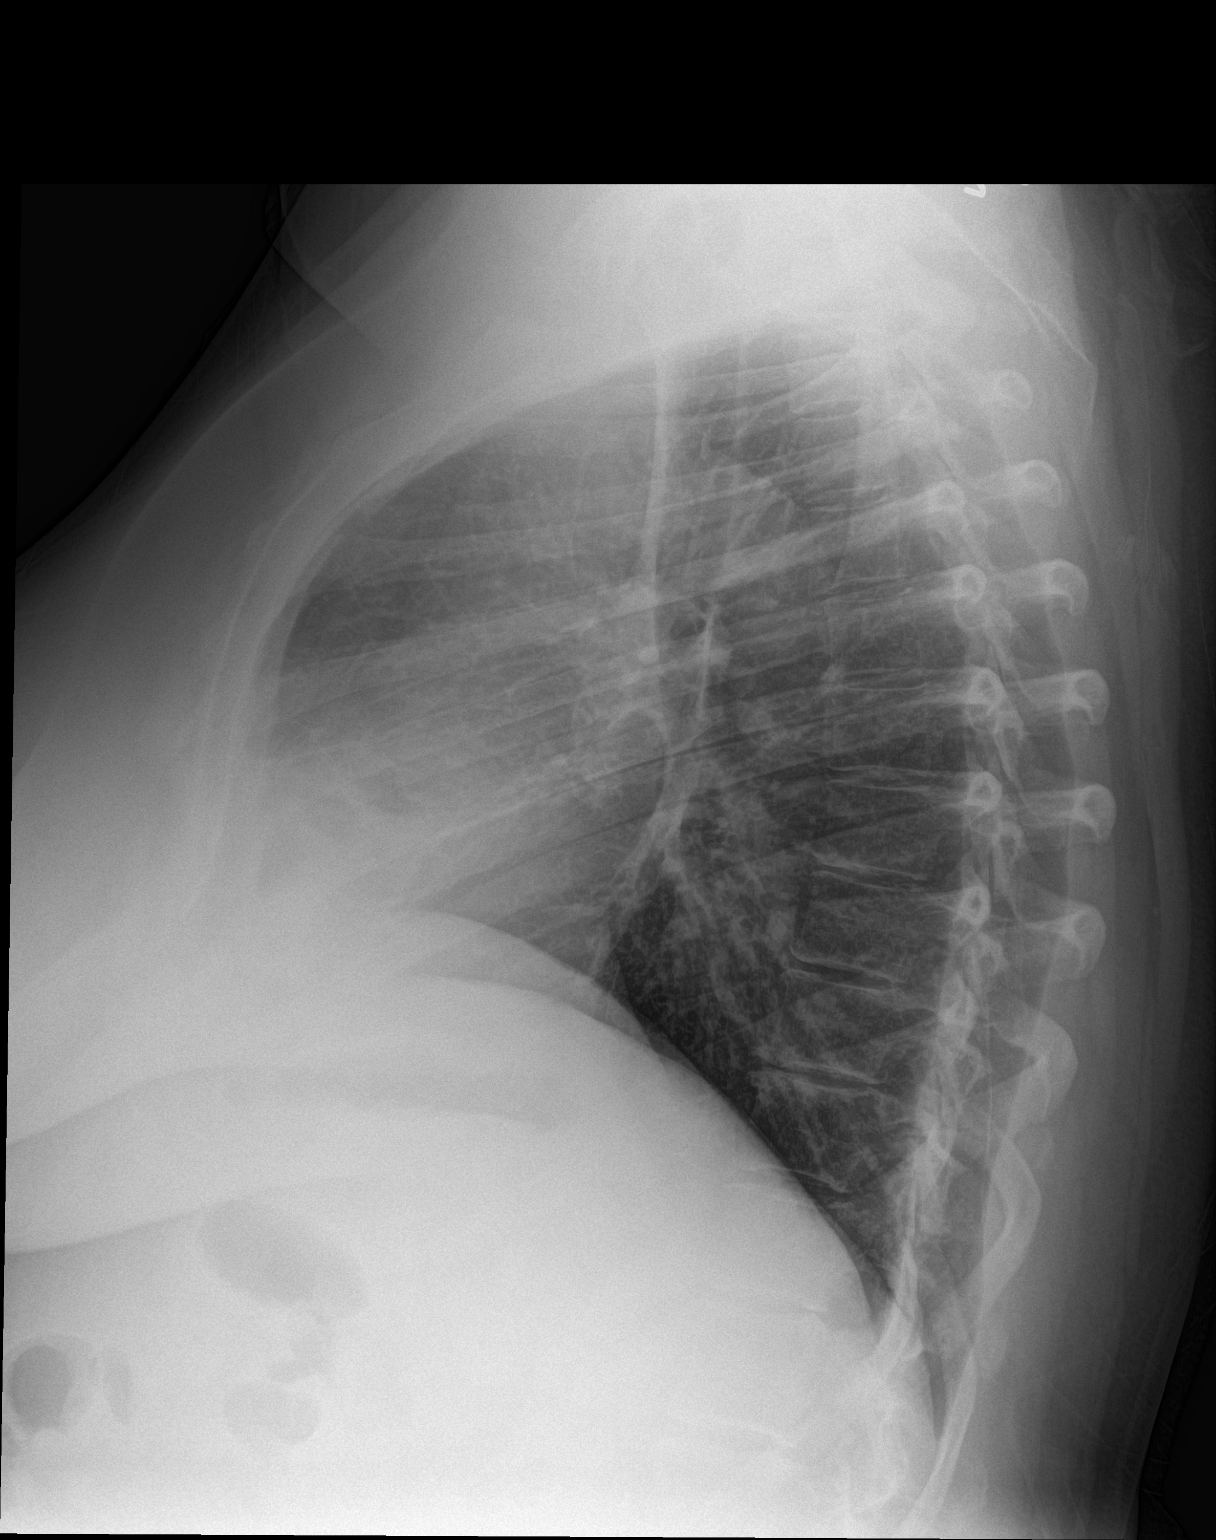

[2 of 2 positions shown; findings below may reference images not displayed]

FINDINGS: The heart size borderline. The hila and mediastinum are
unremarkable. No pneumothorax. Mild atelectasis in the left mid
lung. Mild opacity in the medial right lung base, likely atelectasis
and vascular crowding rather than early infiltrate. No other acute
abnormalities.
IMPRESSION: No active cardiopulmonary disease.

## 2018-02-21 ENCOUNTER — Encounter: Payer: Self-pay | Admitting: Internal Medicine

## 2018-02-27 ENCOUNTER — Ambulatory Visit: Payer: BLUE CROSS/BLUE SHIELD | Admitting: Internal Medicine

## 2018-02-27 ENCOUNTER — Encounter: Payer: Self-pay | Admitting: Internal Medicine

## 2018-02-27 VITALS — BP 124/84 | HR 71 | Temp 98.3°F | Wt 242.0 lb

## 2018-02-27 DIAGNOSIS — G4700 Insomnia, unspecified: Secondary | ICD-10-CM

## 2018-02-27 MED ORDER — TRAZODONE HCL 50 MG PO TABS: 25.0000 mg | ORAL_TABLET | Freq: Every evening | ORAL | 0 refills | Status: DC | PRN

## 2018-02-27 NOTE — Progress Notes (Signed)
Subjective:    Patient ID: Tasha Leonard, female    DOB: [birth year], 57 y.o.   MRN: [id number]  HPI  Pt presents to the clinic today with c/o insomnia. She reports over the last 3 weeks, she has had difficulty falling asleep and staying asleep. She is averaging 2-3 hours of sleep per night. She denies recent increase in stress, anxiety, etc. She is not consuming caffeine before bed. She is not napping during the day. She sleeps in a dark room, with no TV, music or noise present. Her boyfriend does not report that she snores. She has never been told that she has sleep apnea. She has tried Benadryl, Zquil and Melatonin with minimal relief.  Review of Systems      Past Medical History:  Diagnosis Date  . Allergy   . Chicken pox   . Diabetes mellitus without complication ([place])   . Hyperlipidemia   . Hypertension   . Kidney stones     Current Outpatient Medications  Medication Sig Dispense Refill  . Blood Glucose Monitoring Suppl (ONE TOUCH ULTRA SYSTEM KIT) W/DEVICE KIT 1 kit by Does not apply route once. 1 each 0  . cyclobenzaprine (FLEXERIL) 10 MG tablet Take 1 tablet (10 mg total) by mouth daily as needed for muscle spasms. 15 tablet 1  . ezetimibe (ZETIA) 10 MG tablet Take 1 tablet (10 mg total) by mouth daily. 30 tablet 2  . glipiZIDE (GLUCOTROL) 10 MG tablet TAKE 1 TABLET BY MOUTH TWICE (2) DAILY BEFORE A MEAL 60 tablet 3  . glucose blood (ONE TOUCH TEST STRIPS) test strip 1 each by Other route 3 (three) times daily. 200 each 3  . ibuprofen (ADVIL,MOTRIN) 400 MG tablet Take 400 mg by mouth daily as needed.    Marland Kitchen JANUVIA 100 MG tablet TAKE 1 TABLET BY MOUTH ONCE DAILY 30 tablet 2  . lisinopril-hydrochlorothiazide (PRINZIDE,ZESTORETIC) 10-12.5 MG tablet Take 1 tablet by mouth daily. 30 tablet 5  . Loratadine (CLARITIN) 10 MG CAPS Take 1 capsule by mouth daily.    . Nitroglycerin 0.4 % OINT Place 1 application rectally 2 (two) times daily. 30 g 0  . Omega-3 Fatty Acids (FISH  OIL PO) Take 1 capsule by mouth daily.    . ONE TOUCH ULTRA TEST test strip USE TO TEST 3 TIMES DAILY AS DIRECTED 200 each 3  . ONETOUCH DELICA LANCETS 87O MISC USE 3 TIMES DAILY AS DIRECTED 200 each 3  . OZEMPIC 0.25 or 0.5 MG/DOSE SOPN     . simvastatin (ZOCOR) 40 MG tablet Take 1 tablet (40 mg total) by mouth at bedtime. 30 tablet 5  . traMADol (ULTRAM) 50 MG tablet Take 1 tablet (50 mg total) by mouth every 8 (eight) hours as needed. 30 tablet 0  . Vitamin D, Ergocalciferol, (DRISDOL) 50000 units CAPS capsule Take 1 capsule (50,000 Units total) by mouth every 7 (seven) days. 12 capsule 0   No current facility-administered medications for this visit.     Allergies  Allergen Reactions  . Metformin And Related Other (See Comments)    GI upset  . Morphine And Related Other (See Comments)    Migraine headaches    Family History  Problem Relation Age of Onset  . Arthritis Mother   . Cancer Mother   . Hyperlipidemia Mother   . Diabetes Mother   . Hyperlipidemia Father   . Arthritis Maternal Grandmother   . Diabetes Maternal Grandmother   . Arthritis Maternal Grandfather   .  Hyperlipidemia Maternal Grandfather   . Heart disease Maternal Grandfather   . Stroke Maternal Grandfather   . Cancer Paternal Grandmother   . Diabetes Paternal Grandfather     Social History   Socioeconomic History  . Marital status: Single    Spouse name: Not on file  . Number of children: Not on file  . Years of education: Not on file  . Highest education level: Not on file  Occupational History  . Not on file  Social Needs  . Financial resource strain: Not on file  . Food insecurity:    Worry: Not on file    Inability: Not on file  . Transportation needs:    Medical: Not on file    Non-medical: Not on file  Tobacco Use  . Smoking status: Former Smoker    Years: 2.00    Types: Cigarettes  . Smokeless tobacco: Never Used  Substance and Sexual Activity  . Alcohol use: Yes    Comment:  occasional  . Drug use: No  . Sexual activity: Not on file  Lifestyle  . Physical activity:    Days per week: Not on file    Minutes per session: Not on file  . Stress: Not on file  Relationships  . Social connections:    Talks on phone: Not on file    Gets together: Not on file    Attends religious service: Not on file    Active member of club or organization: Not on file    Attends meetings of clubs or organizations: Not on file    Relationship status: Not on file  . Intimate partner violence:    Fear of current or ex partner: Not on file    Emotionally abused: Not on file    Physically abused: Not on file    Forced sexual activity: Not on file  Other Topics Concern  . Not on file  Social History Narrative  . Not on file     Constitutional: Denies fever, malaise, fatigue, headache or abrupt weight changes.  Neurological: Pt reports insomnia. Denies dizziness, difficulty with memory, difficulty with speech or problems with balance and coordination.  Psych: Denies anxiety, depression, SI/HI.  No other specific complaints in a complete review of systems (except as listed in HPI above).  Objective:   Physical Exam  BP 124/84   Pulse 71   Temp 98.3 F (36.8 C) (Oral)   Wt 242 lb (109.8 kg)   SpO2 97%   BMI 40.90 kg/m  Wt Readings from Last 3 Encounters:  02/27/18 242 lb (109.8 kg)  01/05/18 242 lb (109.8 kg)  09/18/17 244 lb 1.9 oz (110.7 kg)    General: Appears her stated age, obese in NAD. Neurological: Alert and oriented.   Psychiatric: Mood and affect normal. Behavior is normal. Judgment and thought content normal.     BMET    Component Value Date/Time   NA 138 01/05/2018 1545   K 4.7 01/05/2018 1545   K 3.8 06/30/2014 1546   CL 100 01/05/2018 1545   CO2 28 01/05/2018 1545   GLUCOSE 199 (H) 01/05/2018 1545   BUN 10 01/05/2018 1545   CREATININE 0.75 01/05/2018 1545   CALCIUM 9.9 01/05/2018 1545   GFRNONAA >60 03/11/2016 1444   GFRAA >60 03/11/2016  1444    Lipid Panel     Component Value Date/Time   CHOL 184 01/05/2018 1545   TRIG 239 (H) 01/05/2018 1545   HDL 41 (L) 01/05/2018 1545  CHOLHDL 4.5 01/05/2018 1545   VLDL 49.6 (H) 09/18/2017 0843   LDLCALC 107 (H) 01/05/2018 1545    CBC    Component Value Date/Time   WBC 10.6 01/05/2018 1545   RBC 5.02 01/05/2018 1545   HGB 14.9 01/05/2018 1545   HCT 42.2 01/05/2018 1545   PLT 304 01/05/2018 1545   MCV 84.1 01/05/2018 1545   MCH 29.7 01/05/2018 1545   MCHC 35.3 01/05/2018 1545   RDW 12.9 01/05/2018 1545    Hgb A1C Lab Results  Component Value Date   HGBA1C 9.7 (H) 01/05/2018      e      Assessment & Plan:

## 2018-02-27 NOTE — Assessment & Plan Note (Signed)
New onset Discussed a good sleep routine Will trial Trazadone, eRx sent to pharmacy She will update my via mychart if no improvement

## 2018-02-27 NOTE — Patient Instructions (Signed)

## 2018-03-21 ENCOUNTER — Encounter: Payer: Self-pay | Admitting: Internal Medicine

## 2018-03-21 MED ORDER — TRAZODONE HCL 50 MG PO TABS: 50.0000 mg | ORAL_TABLET | Freq: Every evening | ORAL | 0 refills | Status: DC | PRN

## 2018-03-28 ENCOUNTER — Other Ambulatory Visit: Payer: Self-pay | Admitting: Internal Medicine

## 2018-03-28 MED ORDER — TRAZODONE HCL 50 MG PO TABS: 50.0000 mg | ORAL_TABLET | Freq: Every evening | ORAL | 2 refills | Status: DC | PRN

## 2018-03-30 DIAGNOSIS — E1169 Type 2 diabetes mellitus with other specified complication: Secondary | ICD-10-CM | POA: Diagnosis not present

## 2018-03-30 DIAGNOSIS — E1165 Type 2 diabetes mellitus with hyperglycemia: Secondary | ICD-10-CM | POA: Diagnosis not present

## 2018-03-30 DIAGNOSIS — E1159 Type 2 diabetes mellitus with other circulatory complications: Secondary | ICD-10-CM | POA: Diagnosis not present

## 2018-03-30 DIAGNOSIS — E785 Hyperlipidemia, unspecified: Secondary | ICD-10-CM | POA: Diagnosis not present

## 2018-03-31 ENCOUNTER — Other Ambulatory Visit: Payer: Self-pay | Admitting: Internal Medicine

## 2018-04-02 MED ORDER — TRAMADOL HCL 50 MG PO TABS: 50.0000 mg | ORAL_TABLET | Freq: Three times a day (TID) | ORAL | 0 refills | Status: DC | PRN

## 2018-04-02 NOTE — Telephone Encounter (Signed)
Baity pt, last filled #30 12/06/2017... Pt takes PRN... Please advise

## 2018-04-17 ENCOUNTER — Other Ambulatory Visit: Payer: Self-pay | Admitting: Internal Medicine

## 2018-04-17 DIAGNOSIS — E119 Type 2 diabetes mellitus without complications: Secondary | ICD-10-CM

## 2018-04-18 ENCOUNTER — Telehealth: Payer: Self-pay | Admitting: Internal Medicine

## 2018-04-18 NOTE — Telephone Encounter (Signed)
Medications needs to be filled by Endocrinology as they are now managing her diabetes, I told the pharmacy the name of provider that needs to receive refill request

## 2018-04-18 NOTE — Telephone Encounter (Signed)
Pt states she is not understanding why the Rx was denied and needs to be sent to Endocrinology... I explained, per our records, the last Rx was from 09/22/18 w/ 2 refills... Pt should have ran out by now and she is also seeing Endo who should pick up treatment.... Pt states Endo told her to continue Glipizide as previously prescribe... I will call [facility] a call in the morning to get fill Hx and from whom

## 2018-04-18 NOTE — Telephone Encounter (Signed)
Copied from [place] [phone]. Topic: General - Other >> Apr 18, 2018  2:39 PM Lennox Solders wrote: Reason for CRM: Pt is calling and would like to know why glipizide was denied. Pt last seen in may 2018. [facility]

## 2018-04-19 NOTE — Telephone Encounter (Signed)
Pt is aware that Rx will be sent to Endo to refill from here on out and the pharamcy is aware as the Dr already fills another Rx on file

## 2018-04-19 NOTE — Telephone Encounter (Signed)
Agree, should be filled by endocrinology at this time as they are managing her diabetes.

## 2018-04-19 NOTE — Telephone Encounter (Signed)
Called the pharmacy and they stated pt had another Rx on file w/ refills so Rx from 09/2017 she did not start to fill until Feb 2019... They will forward request to Endocrinology

## 2018-05-21 ENCOUNTER — Other Ambulatory Visit: Payer: Self-pay | Admitting: Internal Medicine

## 2018-06-03 ENCOUNTER — Encounter: Payer: Self-pay | Admitting: Internal Medicine

## 2018-06-22 ENCOUNTER — Ambulatory Visit: Payer: BLUE CROSS/BLUE SHIELD | Admitting: Internal Medicine

## 2018-07-06 ENCOUNTER — Other Ambulatory Visit: Payer: Self-pay | Admitting: Internal Medicine

## 2018-07-06 DIAGNOSIS — E1159 Type 2 diabetes mellitus with other circulatory complications: Secondary | ICD-10-CM | POA: Diagnosis not present

## 2018-07-06 DIAGNOSIS — E1169 Type 2 diabetes mellitus with other specified complication: Secondary | ICD-10-CM | POA: Diagnosis not present

## 2018-07-06 DIAGNOSIS — E785 Hyperlipidemia, unspecified: Secondary | ICD-10-CM | POA: Diagnosis not present

## 2018-07-06 DIAGNOSIS — E1165 Type 2 diabetes mellitus with hyperglycemia: Secondary | ICD-10-CM | POA: Diagnosis not present

## 2018-07-06 DIAGNOSIS — E559 Vitamin D deficiency, unspecified: Secondary | ICD-10-CM | POA: Insufficient documentation

## 2018-07-09 ENCOUNTER — Other Ambulatory Visit: Payer: Self-pay | Admitting: Internal Medicine

## 2018-07-09 NOTE — Telephone Encounter (Signed)
Last filled 04/02/18... Please advise

## 2018-07-12 ENCOUNTER — Ambulatory Visit (INDEPENDENT_AMBULATORY_CARE_PROVIDER_SITE_OTHER): Payer: BLUE CROSS/BLUE SHIELD

## 2018-07-12 DIAGNOSIS — Z23 Encounter for immunization: Secondary | ICD-10-CM

## 2018-09-07 ENCOUNTER — Other Ambulatory Visit: Payer: Self-pay | Admitting: Internal Medicine

## 2018-09-07 NOTE — Telephone Encounter (Signed)
tramadol last filled 07/09/2018 and flexeril last filled 11/07/2016 #15 with 1 refill.... Please advise

## 2018-09-28 ENCOUNTER — Other Ambulatory Visit: Payer: Self-pay

## 2018-09-28 ENCOUNTER — Emergency Department
Admission: EM | Admit: 2018-09-28 | Discharge: 2018-09-28 | Disposition: A | Discharge status: Home / Self Care | Payer: BLUE CROSS/BLUE SHIELD | Attending: Emergency Medicine | Admitting: Emergency Medicine

## 2018-09-28 ENCOUNTER — Encounter: Payer: Self-pay | Admitting: Emergency Medicine

## 2018-09-28 DIAGNOSIS — M5441 Lumbago with sciatica, right side: Secondary | ICD-10-CM | POA: Insufficient documentation

## 2018-09-28 DIAGNOSIS — I10 Essential (primary) hypertension: Secondary | ICD-10-CM | POA: Insufficient documentation

## 2018-09-28 DIAGNOSIS — E119 Type 2 diabetes mellitus without complications: Secondary | ICD-10-CM | POA: Insufficient documentation

## 2018-09-28 DIAGNOSIS — Z7984 Long term (current) use of oral hypoglycemic drugs: Secondary | ICD-10-CM | POA: Diagnosis not present

## 2018-09-28 DIAGNOSIS — M545 Low back pain: Secondary | ICD-10-CM | POA: Diagnosis not present

## 2018-09-28 DIAGNOSIS — Z87891 Personal history of nicotine dependence: Secondary | ICD-10-CM | POA: Diagnosis not present

## 2018-09-28 DIAGNOSIS — Z79899 Other long term (current) drug therapy: Secondary | ICD-10-CM | POA: Diagnosis not present

## 2018-09-28 MED ORDER — PREDNISONE 10 MG PO TABS: ORAL_TABLET | ORAL | 0 refills | Status: DC

## 2018-09-28 MED ORDER — DEXAMETHASONE SODIUM PHOSPHATE 10 MG/ML IJ SOLN
10.0000 mg | Freq: Once | INTRAMUSCULAR | Status: AC
  Administered 2018-09-28: 10 mg via INTRAMUSCULAR
  Filled 2018-09-28: qty 1

## 2018-09-28 MED ORDER — CYCLOBENZAPRINE HCL 5 MG PO TABS: ORAL_TABLET | ORAL | 0 refills | Status: DC

## 2018-09-28 NOTE — ED Triage Notes (Signed)
Patient complaining of right sided lower back pain started Sun PM, states it started when she laid down in floor to play with a baby.  Hx of sciatica, denies problems with bowels or bladder, denies numbness and tingling.  Ambulatory.

## 2018-09-28 NOTE — ED Provider Notes (Signed)
[facility] Emergency Department Provider Note   ____________________________________________   First MD Initiated Contact with Patient 09/28/18 1006     (approximate)  I have reviewed the triage vital signs and the nursing notes.   HISTORY  Chief Complaint Back Pain   HPI Tasha Leonard is a 57 y.o. female resents to the ED with complaint of low back pain with sciatic type symptoms down her right leg.  Patient states she has had sciatica in the past.  There was no history of an injury.  Patient states that she merely got down on the floor to play with a baby over Christmas.  She has been taking over-the-counter medication including Advil.  She also has a prescription for tramadol and Flexeril which she has been taking without relief.  She denies any saddle anesthesias or incontinence of bowel or bladder.  Her pain as 7 out of 10.   Past Medical History:  Diagnosis Date  . Allergy   . Chicken pox   . Diabetes mellitus without complication ([place])   . Hyperlipidemia   . Hypertension   . Kidney stones     Patient Active Problem List   Diagnosis Date Noted  . Insomnia 02/27/2018  . DM type 2 (diabetes mellitus, type 2) ([place]) 11/08/2013  . Hypertriglyceridemia 11/08/2013  . HTN (hypertension) 10/29/2013    Past Surgical History:  Procedure Laterality Date  . CATARACT EXTRACTION, BILATERAL  04/29/2014,07/08/2014  . COLONOSCOPY N/A 03/30/2015   Procedure: COLONOSCOPY WITH PROPOL;  Surgeon: Lollie Sails, MD;  [facility] ENDOSCOPY;  Service: Endoscopy;  Laterality: N/A;  . RETINAL DETACHMENT SURGERY Right   . WRIST SURGERY Left 1986   Left finger    Prior to Admission medications   Medication Sig Start Date End Date Taking? Authorizing Provider  Blood Glucose Monitoring Suppl (ONE TOUCH ULTRA SYSTEM KIT) W/DEVICE KIT 1 kit by Does not apply route once. 08/21/15   Jearld Fenton, NP  cyclobenzaprine (FLEXERIL) 5 MG tablet 1 or 2 tablets  every 8 hours as needed for muscle spasms. 09/28/18   Johnn Hai, PA-C  ezetimibe (ZETIA) 10 MG tablet TAKE 1 TABLET BY MOUTH ONCE DAILY 09/07/18   Jearld Fenton, NP  glipiZIDE (GLUCOTROL) 10 MG tablet TAKE 1 TABLET BY MOUTH TWICE (2) DAILY BEFORE A MEAL 09/22/17   Baity, Coralie Keens, NP  glucose blood (ONE TOUCH TEST STRIPS) test strip 1 each by Other route 3 (three) times daily. 08/24/15   Jearld Fenton, NP  ibuprofen (ADVIL,MOTRIN) 400 MG tablet Take 400 mg by mouth daily as needed.    [provider]  JANUVIA 100 MG tablet TAKE 1 TABLET BY MOUTH ONCE DAILY 11/21/17   Jearld Fenton, NP  lisinopril-hydrochlorothiazide (PRINZIDE,ZESTORETIC) 10-12.5 MG tablet TAKE 1 TABLET BY MOUTH ONCE DAILY 07/09/18   Jearld Fenton, NP  Loratadine (CLARITIN) 10 MG CAPS Take 1 capsule by mouth daily.    [provider]  Nitroglycerin 0.4 % OINT Place 1 application rectally 2 (two) times daily. 01/05/18   Jearld Fenton, NP  Omega-3 Fatty Acids (FISH OIL PO) Take 1 capsule by mouth daily.    [provider]  ONE TOUCH ULTRA TEST test strip USE TO TEST 3 TIMES DAILY AS DIRECTED 12/27/16   Jearld Fenton, NP  ONETOUCH DELICA LANCETS 93Z MISC USE 3 TIMES DAILY AS DIRECTED 12/27/16   Jearld Fenton, NP  OZEMPIC 0.25 or 0.5 MG/DOSE [facility]  12/29/17  [provider]  predniSONE (DELTASONE) 10 MG tablet Take 6 tablets  today, on day 2 take 5 tablets, day 3 take 4 tablets, day 4 take 3 tablets, day 5 take  2 tablets and 1 tablet the last day 09/28/18   Johnn Hai, PA-C  simvastatin (ZOCOR) 40 MG tablet TAKE 1 TABLET BY MOUTH AT BEDTIME 05/22/18   Jearld Fenton, NP  traMADol (ULTRAM) 50 MG tablet Take 1 tablet (50 mg total) by mouth every 8 (eight) hours as needed. 04/02/18   Venia Carbon, MD  traMADol (ULTRAM) 50 MG tablet TAKE 1 TABLET BY MOUTH EVERY 8 HOURS AS NEEDED 09/07/18   Jearld Fenton, NP  traZODone (DESYREL) 50 MG tablet Take 1 tablet (50 mg total) by mouth at  bedtime as needed for sleep. 03/28/18   Jearld Fenton, NP  Vitamin D, Ergocalciferol, (DRISDOL) 50000 units CAPS capsule Take 1 capsule (50,000 Units total) by mouth every 7 (seven) days. 01/08/18   Jearld Fenton, NP    Allergies Metformin and related and Morphine and related  Family History  Problem Relation Age of Onset  . Arthritis Mother   . Cancer Mother   . Hyperlipidemia Mother   . Diabetes Mother   . Hyperlipidemia Father   . Arthritis Maternal Grandmother   . Diabetes Maternal Grandmother   . Arthritis Maternal Grandfather   . Hyperlipidemia Maternal Grandfather   . Heart disease Maternal Grandfather   . Stroke Maternal Grandfather   . Cancer Paternal Grandmother   . Diabetes Paternal Grandfather     Social History Social History   Tobacco Use  . Smoking status: Former Smoker    Years: 2.00    Types: Cigarettes  . Smokeless tobacco: Never Used  Substance Use Topics  . Alcohol use: Yes    Comment: occasional  . Drug use: No    Review of Systems Constitutional: No fever/chills Cardiovascular: Denies chest pain. Respiratory: Denies shortness of breath. Gastrointestinal: No abdominal pain.  No nausea, no vomiting.   Genitourinary: Negative for dysuria.  Musculoskeletal: Positive low back pain. Skin: Negative for rash. Neurological: Positive for radiculitis right leg. ___________________________________________   PHYSICAL EXAM:  VITAL SIGNS: ED Triage Vitals  Enc Vitals Group     BP 09/28/18 0946 (!) 164/99     Pulse Rate 09/28/18 0946 64     Resp 09/28/18 0946 16     Temp 09/28/18 0946 97.9 F (36.6 C)     Temp Source 09/28/18 0946 Oral     SpO2 09/28/18 0946 98 %     Weight 09/28/18 0948 240 lb (108.9 kg)     Height 09/28/18 0948 '5\' 4"'$  (1.626 m)     Head Circumference --      Peak Flow --      Pain Score 09/28/18 0948 7     Pain Loc --      Pain Edu? --      Excl. in [place]? --    Constitutional: Alert and oriented. Well appearing and in no  acute distress. Eyes: Conjunctivae are normal.  Head: Atraumatic. Neck: No stridor.   Cardiovascular: Normal rate, regular rhythm. Grossly normal heart sounds.  Good peripheral circulation. Respiratory: Normal respiratory effort.  No retractions. Lungs CTAB. Gastrointestinal: Soft and nontender. No distention.  No CVA tenderness. Musculoskeletal: On examination of the back there is no gross deformity and no step-offs on palpation of the lumbar spine.  There is moderate tenderness on palpation of the SI  joints bilaterally.  Range of motion is greatly restricted secondary to patient's discomfort.  Patient sat in a chair in the examination room as sitting on the stretcher increased her pain.  Good muscle strength bilaterally. Neurologic:  Normal speech and language. No gross focal neurologic deficits are appreciated. Skin:  Skin is warm, dry and intact. No rash noted. Psychiatric: Mood and affect are normal. Speech and behavior are normal.  ____________________________________________   LABS (all labs ordered are listed, but only abnormal results are displayed)  Labs Reviewed - No data to display    PROCEDURES  Procedure(s) performed: None  Procedures  Critical Care performed: No  ____________________________________________   INITIAL IMPRESSION / ASSESSMENT AND PLAN / ED COURSE  As part of my medical decision making, I reviewed the following data within the electronic MEDICAL RECORD NUMBER Notes from prior ED visits and [place] Controlled Substance Database  Patient presents to the ED with complaint of sciatica after being on the floor playing with a child.  There was no history of injury.  Patient has had sciatica in the past and this is similar in sensation.  She states that the last time it required steroid injections in her back.  Currently she is taking Flexeril 10 mg for muscle spasms.  Patient was given Decadron 10 mg IM in the department.  She was discharged with a prescription to  taper prednisone over the next 6 days and to continue with Flexeril as needed for muscle spasms.  She is to follow-up with her PCP if any continued problems and for possible referral to an orthopedist.  ____________________________________________   FINAL CLINICAL IMPRESSION(S) / ED DIAGNOSES  Final diagnoses:  Acute bilateral low back pain with right-sided sciatica     ED Discharge Orders         Ordered    cyclobenzaprine (FLEXERIL) 5 MG tablet     09/28/18 1116    predniSONE (DELTASONE) 10 MG tablet     09/28/18 1203           Note:  This document was prepared using Dragon voice recognition software and may include unintentional dictation errors.    Johnn Hai, PA-C 09/28/18 1654    Earleen Newport, MD 10/01/18 [phone]

## 2018-09-28 NOTE — ED Notes (Signed)
See triage note. States she was on the floor with a baby   States she tried to get up  Developed lower back pain which is moving into both buttocks  Ambulates slowly d/t pain  States she has used tramadol and flexeril w/o relief

## 2018-09-28 NOTE — Discharge Instructions (Signed)
Follow-up with your primary care provider if any continued problems.  Begin taking prednisone as directed.  Continue tramadol as needed for pain.  There is a prescription for Flexeril sent to your pharmacy to continue taking.  Also moist heat or ice to your back as needed for discomfort.

## 2018-10-05 ENCOUNTER — Encounter: Payer: Self-pay | Admitting: Internal Medicine

## 2018-10-10 ENCOUNTER — Telehealth: Payer: Self-pay | Admitting: Internal Medicine

## 2018-10-10 NOTE — Telephone Encounter (Signed)
Yes, just put her in any available 30 min slot

## 2018-10-10 NOTE — Telephone Encounter (Signed)
Pt had er follow up with you 10/11/2018.  I had to r/s to 10/18/2018.  Pt wanted to know if she could be worked in sooner.  She wanted to get a referral for her back  Best number [phone]

## 2018-10-11 ENCOUNTER — Ambulatory Visit: Payer: BLUE CROSS/BLUE SHIELD | Admitting: Internal Medicine

## 2018-10-11 NOTE — Telephone Encounter (Signed)
Left message asking pt to call office  °

## 2018-10-18 ENCOUNTER — Ambulatory Visit: Payer: BLUE CROSS/BLUE SHIELD | Admitting: Internal Medicine

## 2018-10-18 ENCOUNTER — Other Ambulatory Visit: Payer: Self-pay | Admitting: Internal Medicine

## 2018-10-18 ENCOUNTER — Encounter: Payer: Self-pay | Admitting: Internal Medicine

## 2018-10-18 ENCOUNTER — Ambulatory Visit (INDEPENDENT_AMBULATORY_CARE_PROVIDER_SITE_OTHER)
Admission: RE | Admit: 2018-10-18 | Discharge: 2018-10-18 | Disposition: A | Discharge status: Home / Self Care | Payer: BLUE CROSS/BLUE SHIELD | Source: Ambulatory Visit | Attending: Internal Medicine | Admitting: Internal Medicine

## 2018-10-18 VITALS — BP 128/80 | HR 80 | Temp 97.9°F | Wt 232.0 lb

## 2018-10-18 DIAGNOSIS — M5441 Lumbago with sciatica, right side: Secondary | ICD-10-CM

## 2018-10-18 DIAGNOSIS — G8929 Other chronic pain: Secondary | ICD-10-CM

## 2018-10-18 DIAGNOSIS — M545 Low back pain: Secondary | ICD-10-CM | POA: Diagnosis not present

## 2018-10-18 NOTE — Patient Instructions (Signed)
Sciatica    Sciatica is pain, numbness, weakness, or tingling along your sciatic nerve. The sciatic nerve starts in the lower back and goes down the back of each leg. Sciatica happens when this nerve is pinched or has pressure put on it. Sciatica usually goes away on its own or with treatment. Sometimes, sciatica may keep coming back (recur).  Follow these instructions at home:  Medicines  · Take over-the-counter and prescription medicines only as told by your doctor.  · Do not drive or use heavy machinery while taking prescription pain medicine.  Managing pain  · If directed, put ice on the affected area.  ? Put ice in a plastic bag.  ? Place a towel between your skin and the bag.  ? Leave the ice on for 20 minutes, 2-3 times a day.  · After icing, apply heat to the affected area before you exercise or as often as told by your doctor. Use the heat source that your doctor tells you to use, such as a moist heat pack or a heating pad.  ? Place a towel between your skin and the heat source.  ? Leave the heat on for 20-30 minutes.  ? Remove the heat if your skin turns bright red. This is especially important if you are unable to feel pain, heat, or cold. You may have a greater risk of getting burned.  Activity  · Return to your normal activities as told by your doctor. Ask your doctor what activities are safe for you.  ? Avoid activities that make your sciatica worse.  · Take short rests during the day. Rest in a lying or standing position. This is usually better than sitting to rest.  ? When you rest for a long time, do some physical activity or stretching between periods of rest.  ? Avoid sitting for a long time without moving. Get up and move around at least one time each hour.  · Exercise and stretch regularly, as told by your doctor.  · Do not lift anything that is heavier than 10 lb (4.5 kg) while you have symptoms of sciatica.  ? Avoid lifting heavy things even when you do not have symptoms.  ? Avoid lifting  heavy things over and over.  · When you lift objects, always lift in a way that is safe for your body. To do this, you should:  ? Bend your knees.  ? Keep the object close to your body.  ? Avoid twisting.  General instructions  · Use good posture.  ? Avoid leaning forward when you are sitting.  ? Avoid hunching over when you are standing.  · Stay at a healthy weight.  · Wear comfortable shoes that support your feet. Avoid wearing high heels.  · Avoid sleeping on a mattress that is too soft or too hard. You might have less pain if you sleep on a mattress that is firm enough to support your back.  · Keep all follow-up visits as told by your doctor. This is important.  Contact a doctor if:  · You have pain that:  ? Wakes you up when you are sleeping.  ? Gets worse when you lie down.  ? Is worse than the pain you have had in the past.  ? Lasts longer than 4 weeks.  · You lose weight for without trying.  Get help right away if:  · You cannot control when you pee (urinate) or poop (have a bowel movement).  · You   have weakness in any of these areas and it gets worse.  ? Lower back.  ? Lower belly (pelvis).  ? Butt (buttocks).  ? Legs.  · You have redness or swelling of your back.  · You have a burning feeling when you pee.  This information is not intended to replace advice given to you by your health care provider. Make sure you discuss any questions you have with your health care provider.  Document Released: 06/28/2008 Document Revised: 02/25/2016 Document Reviewed: 05/29/2015  Elsevier Interactive Patient Education © 2019 [employer]

## 2018-10-18 NOTE — Progress Notes (Signed)
Subjective:    Patient ID: Tasha Leonard, female    DOB: [birth year], 58 y.o.   MRN: [id number]  HPI  Patient presents to the clinic today for ER follow-up.  She went to the ER 12/27 with complaints of low back pain radiating into the right leg.  She denies any injury to the area but had gotten down on the floor to play with a baby during Christmas visit.  She has had sciatica in the past and reports this felt the same.  No imaging was obtained.  She was treated with Prednisone, Tramadol and Flexeril.  She reports the pain has improved since her ER visit but she is concerned because the pain is now moving to the left lower back.  She reports she has seen a specialist in the past and had an injection in her back.  She denies loss of bowel or bladder.  She has not tried anything additional over-the-counter  Review of Systems      Past Medical History:  Diagnosis Date  . Allergy   . Chicken pox   . Diabetes mellitus without complication ([place])   . Hyperlipidemia   . Hypertension   . Kidney stones     Current Outpatient Medications  Medication Sig Dispense Refill  . Blood Glucose Monitoring Suppl (ONE TOUCH ULTRA SYSTEM KIT) W/DEVICE KIT 1 kit by Does not apply route once. 1 each 0  . cyclobenzaprine (FLEXERIL) 5 MG tablet 1 or 2 tablets every 8 hours as needed for muscle spasms. 30 tablet 0  . ezetimibe (ZETIA) 10 MG tablet TAKE 1 TABLET BY MOUTH ONCE DAILY 30 tablet 2  . glipiZIDE (GLUCOTROL) 10 MG tablet TAKE 1 TABLET BY MOUTH TWICE (2) DAILY BEFORE A MEAL 60 tablet 3  . glucose blood (ONE TOUCH TEST STRIPS) test strip 1 each by Other route 3 (three) times daily. 200 each 3  . ibuprofen (ADVIL,MOTRIN) 400 MG tablet Take 400 mg by mouth daily as needed.    Marland Kitchen JANUVIA 100 MG tablet TAKE 1 TABLET BY MOUTH ONCE DAILY 30 tablet 2  . lisinopril-hydrochlorothiazide (PRINZIDE,ZESTORETIC) 10-12.5 MG tablet TAKE 1 TABLET BY MOUTH ONCE DAILY 30 tablet 5  . Loratadine (CLARITIN) 10 MG CAPS  Take 1 capsule by mouth daily.    . Nitroglycerin 0.4 % OINT Place 1 application rectally 2 (two) times daily. 30 g 0  . Omega-3 Fatty Acids (FISH OIL PO) Take 1 capsule by mouth daily.    . ONE TOUCH ULTRA TEST test strip USE TO TEST 3 TIMES DAILY AS DIRECTED 200 each 3  . ONETOUCH DELICA LANCETS 26O MISC USE 3 TIMES DAILY AS DIRECTED 200 each 3  . OZEMPIC 0.25 or 0.5 MG/DOSE SOPN     . simvastatin (ZOCOR) 40 MG tablet TAKE 1 TABLET BY MOUTH AT BEDTIME 30 tablet 5  . traMADol (ULTRAM) 50 MG tablet TAKE 1 TABLET BY MOUTH EVERY 8 HOURS AS NEEDED 30 tablet 0  . traZODone (DESYREL) 50 MG tablet Take 1 tablet (50 mg total) by mouth at bedtime as needed for sleep. 30 tablet 2   No current facility-administered medications for this visit.     Allergies  Allergen Reactions  . Metformin And Related Other (See Comments)    GI upset  . Morphine And Related Other (See Comments)    Migraine headaches    Family History  Problem Relation Age of Onset  . Arthritis Mother   . Cancer Mother   . Hyperlipidemia Mother   .  Diabetes Mother   . Hyperlipidemia Father   . Arthritis Maternal Grandmother   . Diabetes Maternal Grandmother   . Arthritis Maternal Grandfather   . Hyperlipidemia Maternal Grandfather   . Heart disease Maternal Grandfather   . Stroke Maternal Grandfather   . Cancer Paternal Grandmother   . Diabetes Paternal Grandfather     Social History   Socioeconomic History  . Marital status: Single    Spouse name: Not on file  . Number of children: Not on file  . Years of education: Not on file  . Highest education level: Not on file  Occupational History  . Not on file  Social Needs  . Financial resource strain: Not on file  . Food insecurity:    Worry: Not on file    Inability: Not on file  . Transportation needs:    Medical: Not on file    Non-medical: Not on file  Tobacco Use  . Smoking status: Former Smoker    Years: 2.00    Types: Cigarettes  . Smokeless  tobacco: Never Used  Substance and Sexual Activity  . Alcohol use: Yes    Comment: occasional  . Drug use: No  . Sexual activity: Not on file  Lifestyle  . Physical activity:    Days per week: Not on file    Minutes per session: Not on file  . Stress: Not on file  Relationships  . Social connections:    Talks on phone: Not on file    Gets together: Not on file    Attends religious service: Not on file    Active member of club or organization: Not on file    Attends meetings of clubs or organizations: Not on file    Relationship status: Not on file  . Intimate partner violence:    Fear of current or ex partner: Not on file    Emotionally abused: Not on file    Physically abused: Not on file    Forced sexual activity: Not on file  Other Topics Concern  . Not on file  Social History Narrative  . Not on file     Constitutional: Denies fever, malaise, fatigue, headache or abrupt weight changes.  Gastrointestinal: Denies abdominal pain, bloating, constipation, diarrhea or blood in the stool.  GU: Denies urgency, frequency, pain with urination, burning sensation, blood in urine, odor or discharge. Musculoskeletal: Pt reports low back pain with right leg  pain. Denies decrease in range of motion, difficulty with gait,  or joint  swelling.  Skin: Denies redness, rashes, lesions or ulcercations.  Neurological: Denies dizziness, difficulty with memory, difficulty with speech or problems with balance and coordination.   No other specific complaints in a complete review of systems (except as listed in HPI above).  Objective:   Physical Exam  BP 128/80   Pulse 80   Temp 97.9 F (36.6 C) (Oral)   Wt 232 lb (105.2 kg)   SpO2 98%   BMI 39.82 kg/m  Wt Readings from Last 3 Encounters:  10/18/18 232 lb (105.2 kg)  09/28/18 240 lb (108.9 kg)  02/27/18 242 lb (109.8 kg)    General: Appears her stated age, obese, in NAD. Abdomen: Soft and nontender. Normal bowel sounds. No  distention or masses noted.  Musculoskeletal: Flexion of the spine secondary to pain.  Normal extension and rotation spine.  Bony tenderness noted over the lumbar spine.  Bilateral paralumbar muscles.  Strength 5/5 LLE, strength 4/5 RLE.  No difficulty with gait.  Neurological: Alert and oriented.  Positive SLR on the right.   BMET    Component Value Date/Time   NA 138 01/05/2018 1545   K 4.7 01/05/2018 1545   K 3.8 06/30/2014 1546   CL 100 01/05/2018 1545   CO2 28 01/05/2018 1545   GLUCOSE 199 (H) 01/05/2018 1545   BUN 10 01/05/2018 1545   CREATININE 0.75 01/05/2018 1545   CALCIUM 9.9 01/05/2018 1545   GFRNONAA >60 03/11/2016 1444   GFRAA >60 03/11/2016 1444    Lipid Panel     Component Value Date/Time   CHOL 184 01/05/2018 1545   TRIG 239 (H) 01/05/2018 1545   HDL 41 (L) 01/05/2018 1545   CHOLHDL 4.5 01/05/2018 1545   VLDL 49.6 (H) 09/18/2017 0843   LDLCALC 107 (H) 01/05/2018 1545    CBC    Component Value Date/Time   WBC 10.6 01/05/2018 1545   RBC 5.02 01/05/2018 1545   HGB 14.9 01/05/2018 1545   HCT 42.2 01/05/2018 1545   PLT 304 01/05/2018 1545   MCV 84.1 01/05/2018 1545   MCH 29.7 01/05/2018 1545   MCHC 35.3 01/05/2018 1545   RDW 12.9 01/05/2018 1545    Hgb A1C Lab Results  Component Value Date   HGBA1C 9.7 (H) 01/05/2018            Assessment & Plan:   ER follow-up for Chronic Low Back Pain with Right-Sided Sciatica:  ER notes reviewed We will obtain x-ray lumbar spine today Discussed possibility of physical therapy Will obtain MRI if worse Consider referral to neurosurgery versus physiatry Advised her to take Aleve 1 tab twice daily for inflammation, kidney function normal Continue Flexeril and Tramadol as needed for severe pain  We will follow-up after x-rays are back, return precautions discussed Webb Silversmith, NP

## 2018-10-26 DIAGNOSIS — M5416 Radiculopathy, lumbar region: Secondary | ICD-10-CM | POA: Diagnosis not present

## 2018-10-26 DIAGNOSIS — M545 Low back pain: Secondary | ICD-10-CM | POA: Diagnosis not present

## 2018-10-29 DIAGNOSIS — M545 Low back pain: Secondary | ICD-10-CM | POA: Diagnosis not present

## 2018-10-29 DIAGNOSIS — M5416 Radiculopathy, lumbar region: Secondary | ICD-10-CM | POA: Diagnosis not present

## 2018-11-01 DIAGNOSIS — M545 Low back pain: Secondary | ICD-10-CM | POA: Diagnosis not present

## 2018-11-01 DIAGNOSIS — M5416 Radiculopathy, lumbar region: Secondary | ICD-10-CM | POA: Diagnosis not present

## 2018-12-03 DIAGNOSIS — H31103 Choroidal degeneration, unspecified, bilateral: Secondary | ICD-10-CM | POA: Diagnosis not present

## 2018-12-03 DIAGNOSIS — H26492 Other secondary cataract, left eye: Secondary | ICD-10-CM | POA: Diagnosis not present

## 2018-12-07 DIAGNOSIS — H26492 Other secondary cataract, left eye: Secondary | ICD-10-CM | POA: Diagnosis not present

## 2018-12-07 HISTORY — PX: REFRACTIVE SURGERY: SHX103

## 2018-12-13 ENCOUNTER — Other Ambulatory Visit: Payer: Self-pay | Admitting: Internal Medicine

## 2019-01-16 ENCOUNTER — Encounter: Payer: Self-pay | Admitting: Internal Medicine

## 2019-01-16 ENCOUNTER — Other Ambulatory Visit: Payer: Self-pay | Admitting: Internal Medicine

## 2019-01-17 ENCOUNTER — Ambulatory Visit (INDEPENDENT_AMBULATORY_CARE_PROVIDER_SITE_OTHER): Payer: BLUE CROSS/BLUE SHIELD | Admitting: Internal Medicine

## 2019-01-17 ENCOUNTER — Encounter: Payer: Self-pay | Admitting: Internal Medicine

## 2019-01-17 DIAGNOSIS — E781 Pure hyperglyceridemia: Secondary | ICD-10-CM

## 2019-01-17 DIAGNOSIS — I10 Essential (primary) hypertension: Secondary | ICD-10-CM

## 2019-01-17 DIAGNOSIS — E1165 Type 2 diabetes mellitus with hyperglycemia: Secondary | ICD-10-CM | POA: Diagnosis not present

## 2019-01-17 DIAGNOSIS — F5101 Primary insomnia: Secondary | ICD-10-CM | POA: Diagnosis not present

## 2019-01-17 LAB — HM DIABETES EYE EXAM

## 2019-01-17 NOTE — Assessment & Plan Note (Signed)
Will have her set up lab only appt for A1C Microalbumin not needed secondary to ACEI therapy Encouraged her to consume a low carb diet and exercise for weight loss Continue Glipizide and Ozempic- monitor sugars Encouraged daily foot exam Will request copy or yearly eye exam  Flu shot UTD Make nurse visit for pneumovax

## 2019-01-17 NOTE — Assessment & Plan Note (Signed)
Controlled on Lisinopril HCT Reinforced DASH diet and exercise for weight loss Will have her schedule lab only appt for CMET

## 2019-01-17 NOTE — Patient Instructions (Signed)
Fat and Cholesterol Restricted Eating Plan Getting too much fat and cholesterol in your diet may cause health problems. Choosing the right foods helps keep your fat and cholesterol at normal levels. This can keep you from getting certain diseases. Your doctor may recommend an eating plan that includes:  Total fat: ______% or less of total calories a day.  Saturated fat: ______% or less of total calories a day.  Cholesterol: less than _________mg a day.  Fiber: ______g a day. What are tips for following this plan? Meal planning  At meals, divide your plate into four equal parts: ? Fill one-half of your plate with vegetables and green salads. ? Fill one-fourth of your plate with whole grains. ? Fill one-fourth of your plate with low-fat (lean) protein foods.  Eat fish that is high in omega-3 fats at least two times a week. This includes mackerel, tuna, sardines, and salmon.  Eat foods that are high in fiber, such as whole grains, beans, apples, broccoli, carrots, peas, and barley. General tips   Work with your doctor to lose weight if you need to.  Avoid: ? Foods with added sugar. ? Fried foods. ? Foods with partially hydrogenated oils.  Limit alcohol intake to no more than 1 drink a day for nonpregnant women and 2 drinks a day for men. One drink equals 12 oz of beer, 5 oz of wine, or 1 oz of hard liquor. Reading food labels  Check food labels for: ? Trans fats. ? Partially hydrogenated oils. ? Saturated fat (g) in each serving. ? Cholesterol (mg) in each serving. ? Fiber (g) in each serving.  Choose foods with healthy fats, such as: ? Monounsaturated fats. ? Polyunsaturated fats. ? Omega-3 fats.  Choose grain products that have whole grains. Look for the word "whole" as the first word in the ingredient list. Cooking  Cook foods using low-fat methods. These include baking, boiling, grilling, and broiling.  Eat more home-cooked foods. Eat at restaurants and buffets  less often.  Avoid cooking using saturated fats, such as butter, cream, palm oil, palm kernel oil, and coconut oil. Recommended foods  Fruits  All fresh, canned (in natural juice), or frozen fruits. Vegetables  Fresh or frozen vegetables (raw, steamed, roasted, or grilled). Green salads. Grains  Whole grains, such as whole wheat or whole grain breads, crackers, cereals, and pasta. Unsweetened oatmeal, bulgur, barley, quinoa, or brown rice. Corn or whole wheat flour tortillas. Meats and other protein foods  Ground beef (85% or leaner), grass-fed beef, or beef trimmed of fat. Skinless chicken or turkey. Ground chicken or turkey. Pork trimmed of fat. All fish and seafood. Egg whites. Dried beans, peas, or lentils. Unsalted nuts or seeds. Unsalted canned beans. Nut butters without added sugar or oil. Dairy  Low-fat or nonfat dairy products, such as skim or 1% milk, 2% or reduced-fat cheeses, low-fat and fat-free ricotta or cottage cheese, or plain low-fat and nonfat yogurt. Fats and oils  Tub margarine without trans fats. Light or reduced-fat mayonnaise and salad dressings. Avocado. Olive, canola, sesame, or safflower oils. The items listed above may not be a complete list of foods and beverages you can eat. Contact a dietitian for more information. Foods to avoid Fruits  Canned fruit in heavy syrup. Fruit in cream or butter sauce. Fried fruit. Vegetables  Vegetables cooked in cheese, cream, or butter sauce. Fried vegetables. Grains  White bread. White pasta. White rice. Cornbread. Bagels, pastries, and croissants. Crackers and snack foods that contain trans fat   and hydrogenated oils. Meats and other protein foods  Fatty cuts of meat. Ribs, chicken wings, bacon, sausage, bologna, salami, chitterlings, fatback, hot dogs, bratwurst, and packaged lunch meats. Liver and organ meats. Whole eggs and egg yolks. Chicken and turkey with skin. Fried meat. Dairy  Whole or 2% milk, cream,  half-and-half, and cream cheese. Whole milk cheeses. Whole-fat or sweetened yogurt. Full-fat cheeses. Nondairy creamers and whipped toppings. Processed cheese, cheese spreads, and cheese curds. Beverages  Alcohol. Sugar-sweetened drinks such as sodas, lemonade, and fruit drinks. Fats and oils  Butter, stick margarine, lard, shortening, ghee, or bacon fat. Coconut, palm kernel, and palm oils. Sweets and desserts  Corn syrup, sugars, honey, and molasses. Candy. Jam and jelly. Syrup. Sweetened cereals. Cookies, pies, cakes, donuts, muffins, and ice cream. The items listed above may not be a complete list of foods and beverages you should avoid. Contact a dietitian for more information. Summary  Choosing the right foods helps keep your fat and cholesterol at normal levels. This can keep you from getting certain diseases.  At meals, fill one-half of your plate with vegetables and green salads.  Eat high-fiber foods, like whole grains, beans, apples, carrots, peas, and barley.  Limit added sugar, saturated fats, alcohol, and fried foods. This information is not intended to replace advice given to you by your health care provider. Make sure you discuss any questions you have with your health care provider. Document Released: 03/20/2012 Document Revised: 05/23/2018 Document Reviewed: 06/06/2017 Elsevier Interactive Patient Education  2019 [employer] Inc[employer]   

## 2019-01-17 NOTE — Assessment & Plan Note (Signed)
Essentially resolved Will d/c Trazadone

## 2019-01-17 NOTE — Assessment & Plan Note (Signed)
Will have her set up lab only appt for CMET and Lipid profile Encouraged her to consume a low fat diet and increase aerobic exercise Continue Rosuvastatin and Zetia, will adjust if needed based on labs

## 2019-01-17 NOTE — Progress Notes (Signed)
Virtual Visit via Video Note  I connected with Tasha Leonard on 01/17/19 at 11:45 AM EDT by a video enabled telemedicine application and verified that I am speaking with the correct person using two identifiers.   I discussed the limitations of evaluation and management by telemedicine and the availability of in person appointments. The patient expressed understanding and agreed to proceed.  Patient Location:  Provider Location: Office  History of Present Illness:  Pt due for follow up of chronic conditions.   HTN: Her blood pressure at home is 117/90. She is taking Lisinopril HCTZ as prescribed.  ECG from 03/2016 reviewed.  HLD: Her last LDL was 107, triglycerides 239, 01/2018. She denies myalgias on Rosuvastatin. She is taking Zetia as well. She does not consume a low fat diet.  DM 2: Her last A1c was 7.0%, 07/2018. Her sugars range 190-230. She is taking Glipizide and Ozempic as prescribed. She checks her feet routinely. Her last eye exam was 12/2018, [facility]. Flu 07/2018. Pneumovax 08/2014. She still follows with endocrinology.  Insomnia: She has trouble staying asleep. She take Trazadone as needed with good relief of symptoms. She feels like this can be discontinued.  OA: Mainly in her back. Xray from 10/2018 reviewed. She has done PT with great relief. She takes Tramadol and Flexeril as needed with good relief.   Observations/Objective:  Alert and oriented x 3 Obese, NAD Well kempt Behavior, judgement and thought content are normal  Assessment and Plan:  See problem based charting  Follow Up Instructions:    I discussed the assessment and treatment plan with the patient. The patient was provided an opportunity to ask questions and all were answered. The patient agreed with the plan and demonstrated an understanding of the instructions.   The patient was advised to call back or seek an in-person evaluation if the symptoms worsen or if the condition fails to improve  as anticipated.     Webb Silversmith, NP

## 2019-01-21 ENCOUNTER — Encounter: Payer: Self-pay | Admitting: Internal Medicine

## 2019-01-24 ENCOUNTER — Other Ambulatory Visit: Payer: Self-pay

## 2019-01-24 ENCOUNTER — Other Ambulatory Visit (INDEPENDENT_AMBULATORY_CARE_PROVIDER_SITE_OTHER): Payer: BLUE CROSS/BLUE SHIELD

## 2019-01-24 ENCOUNTER — Ambulatory Visit (INDEPENDENT_AMBULATORY_CARE_PROVIDER_SITE_OTHER): Payer: BLUE CROSS/BLUE SHIELD

## 2019-01-24 DIAGNOSIS — Z23 Encounter for immunization: Secondary | ICD-10-CM | POA: Diagnosis not present

## 2019-01-24 DIAGNOSIS — E781 Pure hyperglyceridemia: Secondary | ICD-10-CM | POA: Diagnosis not present

## 2019-01-24 DIAGNOSIS — I10 Essential (primary) hypertension: Secondary | ICD-10-CM

## 2019-01-24 DIAGNOSIS — F5101 Primary insomnia: Secondary | ICD-10-CM

## 2019-01-24 DIAGNOSIS — E1165 Type 2 diabetes mellitus with hyperglycemia: Secondary | ICD-10-CM

## 2019-01-24 LAB — LIPID PANEL
Cholesterol: 96 mg/dL (ref 0–200)
HDL: 37.1 mg/dL — ABNORMAL LOW (ref >39.00)
LDL Cholesterol: 30 mg/dL (ref 0–99)
NonHDL: 59.03
Total CHOL/HDL Ratio: 3
Triglycerides: 147 mg/dL (ref 0.0–149.0)
VLDL: 29.4 mg/dL (ref 0.0–40.0)

## 2019-01-24 LAB — COMPREHENSIVE METABOLIC PANEL
ALT: 15 U/L (ref 0–35)
AST: 18 U/L (ref 0–37)
Albumin: 4.3 g/dL (ref 3.5–5.2)
Alkaline Phosphatase: 82 U/L (ref 39–117)
BUN: 21 mg/dL (ref 6–23)
CO2: 28 mEq/L (ref 19–32)
Calcium: 8.8 mg/dL (ref 8.4–10.5)
Chloride: 101 mEq/L (ref 96–112)
Creatinine, Ser: 0.77 mg/dL (ref 0.40–1.20)
GFR: 77.35 mL/min (ref >60.00)
Glucose, Bld: 264 mg/dL — ABNORMAL HIGH (ref 70–99)
Potassium: 3.9 mEq/L (ref 3.5–5.1)
Sodium: 138 mEq/L (ref 135–145)
Total Bilirubin: 0.6 mg/dL (ref 0.2–1.2)
Total Protein: 7 g/dL (ref 6.0–8.3)

## 2019-01-24 LAB — HEMOGLOBIN A1C: Hgb A1c MFr Bld: 9.5 % — ABNORMAL HIGH (ref 4.6–6.5)

## 2019-01-24 NOTE — Progress Notes (Signed)
Per orders of Webb Silversmith, NP injection of Pneumovax23 given by Kris Mouton. Patient tolerated injection well.

## 2019-01-29 ENCOUNTER — Encounter: Payer: Self-pay | Admitting: Internal Medicine

## 2019-02-12 ENCOUNTER — Other Ambulatory Visit: Payer: Self-pay | Admitting: Internal Medicine

## 2019-04-23 ENCOUNTER — Other Ambulatory Visit: Payer: Self-pay | Admitting: Internal Medicine

## 2019-04-23 NOTE — Telephone Encounter (Signed)
Last filled 03/2018... please advise

## 2019-05-28 ENCOUNTER — Encounter: Payer: Self-pay | Admitting: Internal Medicine

## 2019-05-28 DIAGNOSIS — E1165 Type 2 diabetes mellitus with hyperglycemia: Secondary | ICD-10-CM

## 2019-07-09 ENCOUNTER — Encounter: Payer: Self-pay | Admitting: Internal Medicine

## 2019-07-11 ENCOUNTER — Encounter: Payer: Self-pay | Admitting: Internal Medicine

## 2019-07-11 ENCOUNTER — Ambulatory Visit (INDEPENDENT_AMBULATORY_CARE_PROVIDER_SITE_OTHER): Payer: BC Managed Care – PPO | Admitting: Internal Medicine

## 2019-07-11 VITALS — Temp 96.8°F

## 2019-07-11 DIAGNOSIS — B9789 Other viral agents as the cause of diseases classified elsewhere: Secondary | ICD-10-CM

## 2019-07-11 DIAGNOSIS — J329 Chronic sinusitis, unspecified: Secondary | ICD-10-CM | POA: Diagnosis not present

## 2019-07-11 MED ORDER — FLUTICASONE PROPIONATE 50 MCG/ACT NA SUSP: 2.0000 | Freq: Every day | NASAL | 6 refills | Status: AC

## 2019-07-11 MED ORDER — METHYLPREDNISOLONE ACETATE 80 MG/ML IJ SUSP
80.0000 mg | Freq: Once | INTRAMUSCULAR | Status: AC
  Administered 2019-07-11: 80 mg via INTRAMUSCULAR

## 2019-07-11 NOTE — Addendum Note (Signed)
Addended by: Lurlean Nanny on: 07/11/2019 04:49 PM   Modules accepted: Orders

## 2019-07-11 NOTE — Patient Instructions (Signed)

## 2019-07-11 NOTE — Progress Notes (Signed)
Virtual Visit via Video Note  I connected with Tasha Leonard on 07/11/19 at 10:00 AM EDT by a video enabled telemedicine application and verified that I am speaking with the correct person using two identifiers.  Location: Patient: Home Provider: Office   I discussed the limitations of evaluation and management by telemedicine and the availability of in person appointments. The patient expressed understanding and agreed to proceed.  History of Present Illness:  Patient has a 3 day history of sinus congestion causing facial pressure and frontal headache. She describes her cough as non productive today but did have some green nasal mucus earlier this week. She's also experiencing some nausea today but no vomiting.She denies fever, chills or body aches. She has taken Tylenol severe cold medication and Claritin with minimal relief.     Past Medical History:  Diagnosis Date  . Allergy   . Chicken pox   . Diabetes mellitus without complication ([place])   . Hyperlipidemia   . Hypertension   . Kidney stones     Current Outpatient Medications  Medication Sig Dispense Refill  . Blood Glucose Monitoring Suppl (ONE TOUCH ULTRA SYSTEM KIT) W/DEVICE KIT 1 kit by Does not apply route once. 1 each 0  . ezetimibe (ZETIA) 10 MG tablet Take 1 tablet (10 mg total) by mouth daily. 30 tablet 3  . glipiZIDE (GLUCOTROL) 10 MG tablet TAKE 1 TABLET BY MOUTH TWICE (2) DAILY BEFORE A MEAL 60 tablet 3  . glucose blood (ONE TOUCH TEST STRIPS) test strip 1 each by Other route 3 (three) times daily. 200 each 3  . ibuprofen (ADVIL,MOTRIN) 400 MG tablet Take 400 mg by mouth daily as needed.    Marland Kitchen lisinopril-hydrochlorothiazide (ZESTORETIC) 10-12.5 MG tablet TAKE 1 TABLET BY MOUTH ONCE A DAY 30 tablet 3  . Loratadine (CLARITIN) 10 MG CAPS Take 1 capsule by mouth daily.    . Omega-3 Fatty Acids (FISH OIL PO) Take 1 capsule by mouth daily.    . ONE TOUCH ULTRA TEST test strip USE TO TEST 3 TIMES DAILY AS DIRECTED  200 each 3  . ONETOUCH DELICA LANCETS 35T MISC USE 3 TIMES DAILY AS DIRECTED 200 each 3  . rosuvastatin (CRESTOR) 20 MG tablet     . Semaglutide, 1 MG/DOSE, (OZEMPIC, 1 MG/DOSE,) 2 MG/1.5ML SOPN Inject 0.75 mLs into the skin every 7 (seven) days.    . traMADol (ULTRAM) 50 MG tablet TAKE 1 TABLET BY MOUTH EVERY 8 HOURS AS NEEDED 30 tablet 0   No current facility-administered medications for this visit.     Allergies  Allergen Reactions  . Metformin And Related Other (See Comments)    GI upset  . Morphine And Related Other (See Comments)    Migraine headaches    Family History  Problem Relation Age of Onset  . Arthritis Mother   . Cancer Mother   . Hyperlipidemia Mother   . Diabetes Mother   . Hyperlipidemia Father   . Arthritis Maternal Grandmother   . Diabetes Maternal Grandmother   . Arthritis Maternal Grandfather   . Hyperlipidemia Maternal Grandfather   . Heart disease Maternal Grandfather   . Stroke Maternal Grandfather   . Cancer Paternal Grandmother   . Diabetes Paternal Grandfather     Social History   Socioeconomic History  . Marital status: Single    Spouse name: Not on file  . Number of children: Not on file  . Years of education: Not on file  . Highest education level: Not  on file  Occupational History  . Not on file  Social Needs  . Financial resource strain: Not on file  . Food insecurity    Worry: Not on file    Inability: Not on file  . Transportation needs    Medical: Not on file    Non-medical: Not on file  Tobacco Use  . Smoking status: Former Smoker    Years: 2.00    Types: Cigarettes  . Smokeless tobacco: Never Used  Substance and Sexual Activity  . Alcohol use: Yes    Comment: occasional  . Drug use: No  . Sexual activity: Not on file  Lifestyle  . Physical activity    Days per week: Not on file    Minutes per session: Not on file  . Stress: Not on file  Relationships  . Social Herbalist on phone: Not on file     Gets together: Not on file    Attends religious service: Not on file    Active member of club or organization: Not on file    Attends meetings of clubs or organizations: Not on file    Relationship status: Not on file  . Intimate partner violence    Fear of current or ex partner: Not on file    Emotionally abused: Not on file    Physically abused: Not on file    Forced sexual activity: Not on file  Other Topics Concern  . Not on file  Social History Narrative  . Not on file     Constitutional: Pt reports headache.  Denies fever, malaise, fatigue, or abrupt weight changes.  HEENT: Pt reports nasal congestion and post nasal drip. Denies eye pain, eye redness, ear pain, runny nose or bloody nose. Respiratory: Pt reports cough.  Denies difficulty breathing, shortness of breath, or sputum production.   Cardiovascular: Denies chest pain, chest tightness, palpitations or swelling in the hands or feet.  Gastrointestinal: Pt reports nausea. Denies abdominal pain, bloating, constipation, diarrhea or blood in the stool.  .   No other specific complaints in a complete review of systems (except as listed in HPI above).  Observations/Objective:  Wt Readings from Last 3 Encounters:  10/18/18 232 lb (105.2 kg)  09/28/18 240 lb (108.9 kg)  02/27/18 242 lb (109.8 kg)    General: Obese,  in NAD. HEENT: No audible congestion or hoarseness Pulmonary/Chest: Normal effort. No respiratory distress.   Neurological: Alert and oriented.     BMET    Component Value Date/Time   NA 138 01/24/2019 0856   K 3.9 01/24/2019 0856   K 3.8 06/30/2014 1546   CL 101 01/24/2019 0856   CO2 28 01/24/2019 0856   GLUCOSE 264 (H) 01/24/2019 0856   BUN 21 01/24/2019 0856   CREATININE 0.77 01/24/2019 0856   CREATININE 0.75 01/05/2018 1545   CALCIUM 8.8 01/24/2019 0856   GFRNONAA >60 03/11/2016 1444   GFRAA >60 03/11/2016 1444    Lipid Panel     Component Value Date/Time   CHOL 96 01/24/2019 0856    TRIG 147.0 01/24/2019 0856   HDL 37.10 (L) 01/24/2019 0856   CHOLHDL 3 01/24/2019 0856   VLDL 29.4 01/24/2019 0856   LDLCALC 30 01/24/2019 0856   LDLCALC 107 (H) 01/05/2018 1545    CBC    Component Value Date/Time   WBC 10.6 01/05/2018 1545   RBC 5.02 01/05/2018 1545   HGB 14.9 01/05/2018 1545   HCT 42.2 01/05/2018 1545   PLT  304 01/05/2018 1545   MCV 84.1 01/05/2018 1545   MCH 29.7 01/05/2018 1545   MCHC 35.3 01/05/2018 1545   RDW 12.9 01/05/2018 1545    Hgb A1C Lab Results  Component Value Date   HGBA1C 9.5 (H) 01/24/2019       Assessment and Plan:  Viral Sinusitis:  Continue Claritin and initiate Flonase. 80 mg Depo Medrol injection IM today If no improvement by Monday, will send in Augmentin BID x 10 days  Return precautions discussed  Follow Up Instructions:    I discussed the assessment and treatment plan with the patient. The patient was provided an opportunity to ask questions and all were answered. The patient agreed with the plan and demonstrated an understanding of the instructions.   The patient was advised to call back or seek an in-person evaluation if the symptoms worsen or if the condition fails to improve as anticipated.    Webb Silversmith, NP

## 2019-07-12 ENCOUNTER — Other Ambulatory Visit: Payer: Self-pay | Admitting: Internal Medicine

## 2019-07-18 ENCOUNTER — Encounter: Payer: Self-pay | Admitting: Internal Medicine

## 2019-08-13 ENCOUNTER — Other Ambulatory Visit: Payer: Self-pay

## 2019-08-13 ENCOUNTER — Encounter: Payer: Self-pay | Admitting: Internal Medicine

## 2019-08-13 DIAGNOSIS — Z20822 Contact with and (suspected) exposure to covid-19: Secondary | ICD-10-CM

## 2019-08-15 ENCOUNTER — Encounter: Payer: Self-pay | Admitting: Internal Medicine

## 2019-08-15 LAB — NOVEL CORONAVIRUS, NAA: SARS-CoV-2, NAA: NOT DETECTED

## 2019-08-23 ENCOUNTER — Encounter: Payer: Self-pay | Admitting: Internal Medicine

## 2019-08-23 ENCOUNTER — Ambulatory Visit (INDEPENDENT_AMBULATORY_CARE_PROVIDER_SITE_OTHER): Payer: BC Managed Care – PPO | Admitting: Internal Medicine

## 2019-08-23 ENCOUNTER — Other Ambulatory Visit: Payer: Self-pay

## 2019-08-23 VITALS — BP 122/84 | HR 81 | Temp 98.2°F | Ht 64.5 in | Wt 234.0 lb

## 2019-08-23 DIAGNOSIS — E559 Vitamin D deficiency, unspecified: Secondary | ICD-10-CM | POA: Diagnosis not present

## 2019-08-23 DIAGNOSIS — I10 Essential (primary) hypertension: Secondary | ICD-10-CM

## 2019-08-23 DIAGNOSIS — E1165 Type 2 diabetes mellitus with hyperglycemia: Secondary | ICD-10-CM

## 2019-08-23 DIAGNOSIS — E781 Pure hyperglyceridemia: Secondary | ICD-10-CM

## 2019-08-23 DIAGNOSIS — Z0000 Encounter for general adult medical examination without abnormal findings: Secondary | ICD-10-CM

## 2019-08-23 DIAGNOSIS — F5101 Primary insomnia: Secondary | ICD-10-CM | POA: Diagnosis not present

## 2019-08-23 NOTE — Assessment & Plan Note (Signed)
Continue Melatonin OTC

## 2019-08-23 NOTE — Assessment & Plan Note (Signed)
CMET and lipid profile today Encouraged her to consume a low fat diet Continue Rosuvastatin and Ezetimibe 

## 2019-08-23 NOTE — Patient Instructions (Signed)
Health Maintenance, Female Adopting a healthy lifestyle and getting preventive care are important in promoting health and wellness. Ask your health care provider about:  The right schedule for you to have regular tests and exams.  Things you can do on your own to prevent diseases and keep yourself healthy. What should I know about diet, weight, and exercise? Eat a healthy diet   Eat a diet that includes plenty of vegetables, fruits, low-fat dairy products, and lean protein.  Do not eat a lot of foods that are high in solid fats, added sugars, or sodium. Maintain a healthy weight Body mass index (BMI) is used to identify weight problems. It estimates body fat based on height and weight. Your health care provider can help determine your BMI and help you achieve or maintain a healthy weight. Get regular exercise Get regular exercise. This is one of the most important things you can do for your health. Most adults should:  Exercise for at least 150 minutes each week. The exercise should increase your heart rate and make you sweat (moderate-intensity exercise).  Do strengthening exercises at least twice a week. This is in addition to the moderate-intensity exercise.  Spend less time sitting. Even light physical activity can be beneficial. Watch cholesterol and blood lipids Have your blood tested for lipids and cholesterol at 57 years of age, then have this test every 5 years. Have your cholesterol levels checked more often if:  Your lipid or cholesterol levels are high.  You are older than 57 years of age.  You are at high risk for heart disease. What should I know about cancer screening? Depending on your health history and family history, you may need to have cancer screening at various ages. This may include screening for:  Breast cancer.  Cervical cancer.  Colorectal cancer.  Skin cancer.  Lung cancer. What should I know about heart disease, diabetes, and high blood  pressure? Blood pressure and heart disease  High blood pressure causes heart disease and increases the risk of stroke. This is more likely to develop in people who have high blood pressure readings, are of African descent, or are overweight.  Have your blood pressure checked: ? Every 3-5 years if you are 18-39 years of age. ? Every year if you are 40 years old or older. Diabetes Have regular diabetes screenings. This checks your fasting blood sugar level. Have the screening done:  Once every three years after age 40 if you are at a normal weight and have a low risk for diabetes.  More often and at a younger age if you are overweight or have a high risk for diabetes. What should I know about preventing infection? Hepatitis B If you have a higher risk for hepatitis B, you should be screened for this virus. Talk with your health care provider to find out if you are at risk for hepatitis B infection. Hepatitis C Testing is recommended for:  Everyone born from 1945 through 1965.  Anyone with known risk factors for hepatitis C. Sexually transmitted infections (STIs)  Get screened for STIs, including gonorrhea and chlamydia, if: ? You are sexually active and are younger than 57 years of age. ? You are older than 57 years of age and your health care provider tells you that you are at risk for this type of infection. ? Your sexual activity has changed since you were last screened, and you are at increased risk for chlamydia or gonorrhea. Ask your health care provider if   you are at risk.  Ask your health care provider about whether you are at high risk for HIV. Your health care provider may recommend a prescription medicine to help prevent HIV infection. If you choose to take medicine to prevent HIV, you should first get tested for HIV. You should then be tested every 3 months for as long as you are taking the medicine. Pregnancy  If you are about to stop having your period (premenopausal) and  you may become pregnant, seek counseling before you get pregnant.  Take 400 to 800 micrograms (mcg) of folic acid every day if you become pregnant.  Ask for birth control (contraception) if you want to prevent pregnancy. Osteoporosis and menopause Osteoporosis is a disease in which the bones lose minerals and strength with aging. This can result in bone fractures. If you are 65 years old or older, or if you are at risk for osteoporosis and fractures, ask your health care provider if you should:  Be screened for bone loss.  Take a calcium or vitamin D supplement to lower your risk of fractures.  Be given hormone replacement therapy (HRT) to treat symptoms of menopause. Follow these instructions at home: Lifestyle  Do not use any products that contain nicotine or tobacco, such as cigarettes, e-cigarettes, and chewing tobacco. If you need help quitting, ask your health care provider.  Do not use street drugs.  Do not share needles.  Ask your health care provider for help if you need support or information about quitting drugs. Alcohol use  Do not drink alcohol if: ? Your health care provider tells you not to drink. ? You are pregnant, may be pregnant, or are planning to become pregnant.  If you drink alcohol: ? Limit how much you use to 0-1 drink a day. ? Limit intake if you are breastfeeding.  Be aware of how much alcohol is in your drink. In the [place] one drink equals one 12 oz bottle of beer (355 mL), one 5 oz glass of wine (148 mL), or one 1 oz glass of hard liquor (44 mL). General instructions  Schedule regular health, dental, and eye exams.  Stay current with your vaccines.  Tell your health care provider if: ? You often feel depressed. ? You have ever been abused or do not feel safe at home. Summary  Adopting a healthy lifestyle and getting preventive care are important in promoting health and wellness.  Follow your health care provider's instructions about healthy  diet, exercising, and getting tested or screened for diseases.  Follow your health care provider's instructions on monitoring your cholesterol and blood pressure. This information is not intended to replace advice given to you by your health care provider. Make sure you discuss any questions you have with your health care provider. Document Released: 04/04/2011 Document Revised: 09/12/2018 Document Reviewed: 09/12/2018 Elsevier Patient Education  2020 Else[employer] Inc[employer]  

## 2019-08-23 NOTE — Progress Notes (Signed)
Subjective:    Patient ID: Tasha Leonard, female    DOB: 04/14/1961, 58 y.o.   MRN: [id number]  HPI  Pt presents to the clinic today for her annual exam. She is also due to follow up chronic conditions.  HTN: Her BP today is 122/84. She is taking Lisinopril HCT as prescribed. ECG from 03/2016 reviewed.   HLD: Her last LDL was 30, triglycerides 147, 01/2019. She denies myalgias on Rosuvastatin. She is taking Ezetimibe as well. She has been trying to consume a low fat diet.  DM 2: Her last A1C was 9.5%, 01/2019. She is taking Glipizide and Ozempic as prescribed. Her fasting sugars run 232. She denies hypoglycemia. She checks her feet routinely. She is following with endocrinology.  Insomnia: She has trouble falling asleep and staying asleep. She averages about 4-5 hrs of sleep. She is taking OTC Melatonin medication for this at this time which has helped. There is no sleep study on file.  Flu: 07/2018 Tetanus: 11/2016 Pneumovax: 01/2019 Pap Smear: 02/2015 Mammogram: 03/2015 Bone Density: never Colon Screening: 03/2015 Vision Screening: Annually  Dentist: PRN  Diet: She eats meats. She eats fruits and veggies daily. She eats fried foods but has been trying to stay away from them lately. She drinks mostly water and coffee with occasionally Dr Malachi Bonds.  Exercise: She has been going to gym for a few days but then stopped due to [place].   Review of Systems      Past Medical History:  Diagnosis Date  . Allergy   . Chicken pox   . Diabetes mellitus without complication ([place])   . Hyperlipidemia   . Hypertension   . Kidney stones     Current Outpatient Medications  Medication Sig Dispense Refill  . Blood Glucose Monitoring Suppl (ONE TOUCH ULTRA SYSTEM KIT) W/DEVICE KIT 1 kit by Does not apply route once. 1 each 0  . ezetimibe (ZETIA) 10 MG tablet TAKE 1 TABLET BY MOUTH ONCE DAILY 30 tablet 3  . fluticasone (FLONASE) 50 MCG/ACT nasal spray Place 2 sprays into both nostrils daily. 16  g 6  . glipiZIDE (GLUCOTROL) 10 MG tablet TAKE 1 TABLET BY MOUTH TWICE (2) DAILY BEFORE A MEAL 60 tablet 3  . glucose blood (ONE TOUCH TEST STRIPS) test strip 1 each by Other route 3 (three) times daily. 200 each 3  . ibuprofen (ADVIL,MOTRIN) 400 MG tablet Take 400 mg by mouth daily as needed.    Marland Kitchen lisinopril-hydrochlorothiazide (ZESTORETIC) 10-12.5 MG tablet TAKE 1 TABLET BY MOUTH ONCE A DAY 30 tablet 3  . Loratadine (CLARITIN) 10 MG CAPS Take 1 capsule by mouth daily.    . Omega-3 Fatty Acids (FISH OIL PO) Take 1 capsule by mouth daily.    . ONE TOUCH ULTRA TEST test strip USE TO TEST 3 TIMES DAILY AS DIRECTED 200 each 3  . ONETOUCH DELICA LANCETS 07H MISC USE 3 TIMES DAILY AS DIRECTED 200 each 3  . rosuvastatin (CRESTOR) 20 MG tablet     . Semaglutide, 1 MG/DOSE, (OZEMPIC, 1 MG/DOSE,) 2 MG/1.5ML SOPN Inject 0.75 mLs into the skin every 7 (seven) days.    . traMADol (ULTRAM) 50 MG tablet TAKE 1 TABLET BY MOUTH EVERY 8 HOURS AS NEEDED 30 tablet 0   No current facility-administered medications for this visit.     Allergies  Allergen Reactions  . Metformin And Related Other (See Comments)    GI upset  . Morphine And Related Other (See Comments)    Migraine  headaches    Family History  Problem Relation Age of Onset  . Arthritis Mother   . Cancer Mother   . Hyperlipidemia Mother   . Diabetes Mother   . Hyperlipidemia Father   . Arthritis Maternal Grandmother   . Diabetes Maternal Grandmother   . Arthritis Maternal Grandfather   . Hyperlipidemia Maternal Grandfather   . Heart disease Maternal Grandfather   . Stroke Maternal Grandfather   . Cancer Paternal Grandmother   . Diabetes Paternal Grandfather     Social History   Socioeconomic History  . Marital status: Single    Spouse name: Not on file  . Number of children: Not on file  . Years of education: Not on file  . Highest education level: Not on file  Occupational History  . Not on file  Social Needs  . Financial  resource strain: Not on file  . Food insecurity    Worry: Not on file    Inability: Not on file  . Transportation needs    Medical: Not on file    Non-medical: Not on file  Tobacco Use  . Smoking status: Former Smoker    Years: 2.00    Types: Cigarettes  . Smokeless tobacco: Never Used  Substance and Sexual Activity  . Alcohol use: Yes    Comment: occasional  . Drug use: No  . Sexual activity: Not on file  Lifestyle  . Physical activity    Days per week: Not on file    Minutes per session: Not on file  . Stress: Not on file  Relationships  . Social Herbalist on phone: Not on file    Gets together: Not on file    Attends religious service: Not on file    Active member of club or organization: Not on file    Attends meetings of clubs or organizations: Not on file    Relationship status: Not on file  . Intimate partner violence    Fear of current or ex partner: Not on file    Emotionally abused: Not on file    Physically abused: Not on file    Forced sexual activity: Not on file  Other Topics Concern  . Not on file  Social History Narrative  . Not on file     Constitutional: Denies fever, malaise, fatigue, headache or abrupt weight changes.  HEENT: Denies eye pain, eye redness, ear pain, ringing in the ears, wax buildup, runny nose, nasal congestion, bloody nose, or sore throat. Respiratory: Denies difficulty breathing, shortness of breath, cough or sputum production.   Cardiovascular: Denies chest pain, chest tightness, palpitations or swelling in the hands or feet.  Gastrointestinal: Denies abdominal pain, bloating, constipation, diarrhea or blood in the stool.  GU: Denies urgency, frequency, pain with urination, burning sensation, blood in urine, odor or discharge. Musculoskeletal: Denies decrease in range of motion, difficulty with gait, muscle pain or joint pain and swelling.  Skin: Denies redness, rashes, lesions or ulcercations.  Neurological: Pt  reports insomnia. Denies dizziness, difficulty with memory, difficulty with speech or problems with balance and coordination.  Psych: Denies anxiety, depression, SI/HI.  No other specific complaints in a complete review of systems (except as listed in HPI above).  Objective:   Physical Exam   BP 122/84   Pulse 81   Temp 98.2 F (36.8 C) (Temporal)   Ht 5' 4.5" (1.638 m)   Wt 234 lb (106.1 kg)   SpO2 97%   BMI 39.55  kg/m   Wt Readings from Last 3 Encounters:  10/18/18 232 lb (105.2 kg)  09/28/18 240 lb (108.9 kg)  02/27/18 242 lb (109.8 kg)    General: Appears her stated age, obese, in NAD. Skin: Warm, dry and intact. No ulcerations noted. HEENT: Head: normal shape and size; Eyes: sclera white, no icterus, conjunctiva pink  and EOMs intact Neck:  Neck supple, trachea midline. No masses, lumps or thyromegaly present.  Cardiovascular: Normal rate and rhythm. S1,S2 noted.  No murmur, rubs or gallops noted. No JVD or BLE edema. No carotid bruits noted. Pulmonary/Chest: Normal effort and positive vesicular breath sounds. No respiratory distress. No wheezes, rales or ronchi noted.  Abdomen: Soft and nontender. Normal bowel sounds. No distention or masses noted. Liver, spleen and kidneys non palpable. Musculoskeletal: Strength 5/5 BUE/BLE. No difficulty with gait.  Neurological: Alert and oriented. Cranial nerves II-XII grossly intact. Coordination normal.  Psychiatric: Mood and affect normal. Behavior is normal. Judgment and thought content normal.     BMET    Component Value Date/Time   NA 138 01/24/2019 0856   K 3.9 01/24/2019 0856   K 3.8 06/30/2014 1546   CL 101 01/24/2019 0856   CO2 28 01/24/2019 0856   GLUCOSE 264 (H) 01/24/2019 0856   BUN 21 01/24/2019 0856   CREATININE 0.77 01/24/2019 0856   CREATININE 0.75 01/05/2018 1545   CALCIUM 8.8 01/24/2019 0856   GFRNONAA >60 03/11/2016 1444   GFRAA >60 03/11/2016 1444    Lipid Panel     Component Value Date/Time    CHOL 96 01/24/2019 0856   TRIG 147.0 01/24/2019 0856   HDL 37.10 (L) 01/24/2019 0856   CHOLHDL 3 01/24/2019 0856   VLDL 29.4 01/24/2019 0856   LDLCALC 30 01/24/2019 0856   LDLCALC 107 (H) 01/05/2018 1545    CBC    Component Value Date/Time   WBC 10.6 01/05/2018 1545   RBC 5.02 01/05/2018 1545   HGB 14.9 01/05/2018 1545   HCT 42.2 01/05/2018 1545   PLT 304 01/05/2018 1545   MCV 84.1 01/05/2018 1545   MCH 29.7 01/05/2018 1545   MCHC 35.3 01/05/2018 1545   RDW 12.9 01/05/2018 1545    Hgb A1C Lab Results  Component Value Date   HGBA1C 9.5 (H) 01/24/2019           Assessment & Plan:   Preventative Health Maintneance:  Flu shot today Tetanus UTD Pneumovax UTD Pap smear due 2021 Mammogram- she will schedule for 2021 She declines bone density at this time Colon screening UTD Encouraged her to consume a balanced diet and exercise regimen Advised her to see an eye doctor and dentist annually Will check CBC, CMET, Lipid, A1C and Vit D today  RTC in 6 months, follow up chronic conditions Webb Silversmith, NP   This visit occurred during the SARS-CoV-2 public health emergency.  Safety protocols were in place, including screening questions prior to the visit, additional usage of staff PPE, and extensive cleaning of exam room while observing appropriate contact time as indicated for disinfecting solutions.

## 2019-08-23 NOTE — Assessment & Plan Note (Signed)
A1C today No microalbumin secondary to ACEI therapy Encouraged her to consume a low carb diet and exercise for weight loss Foot exam today Encouraged yearly eye exam Continue Glipizide and Ozempic She will continue to follow with dermatology Flu shot today Pneumovax UTD

## 2019-08-23 NOTE — Assessment & Plan Note (Signed)
Controlled on Lisinopril HCT Reinforced DASH diet and exercise for weight loss CMET today 

## 2019-08-24 LAB — CBC
HCT: 42.3 % (ref 35.0–45.0)
Hemoglobin: 14.4 g/dL (ref 11.7–15.5)
MCH: 29.5 pg (ref 27.0–33.0)
MCHC: 34 g/dL (ref 32.0–36.0)
MCV: 86.7 fL (ref 80.0–100.0)
MPV: 10.6 fL (ref 7.5–12.5)
Platelets: 316 10*3/uL (ref 140–400)
RBC: 4.88 10*6/uL (ref 3.80–5.10)
RDW: 13.1 % (ref 11.0–15.0)
WBC: 11.3 10*3/uL — ABNORMAL HIGH (ref 3.8–10.8)

## 2019-08-24 LAB — LIPID PANEL
Cholesterol: 114 mg/dL (ref <200)
HDL: 48 mg/dL — ABNORMAL LOW (ref >=50)
LDL Cholesterol (Calc): 41 mg/dL (calc)
Non-HDL Cholesterol (Calc): 66 mg/dL (calc) (ref <130)
Total CHOL/HDL Ratio: 2.4 (calc) (ref <5.0)
Triglycerides: 168 mg/dL — ABNORMAL HIGH (ref <150)

## 2019-08-24 LAB — COMPREHENSIVE METABOLIC PANEL
AG Ratio: 1.6 (calc) (ref 1.0–2.5)
ALT: 17 U/L (ref 6–29)
AST: 21 U/L (ref 10–35)
Albumin: 4.6 g/dL (ref 3.6–5.1)
Alkaline phosphatase (APISO): 84 U/L (ref 37–153)
BUN: 13 mg/dL (ref 7–25)
CO2: 28 mmol/L (ref 20–32)
Calcium: 10.1 mg/dL (ref 8.6–10.4)
Chloride: 100 mmol/L (ref 98–110)
Creat: 0.78 mg/dL (ref 0.50–1.05)
Globulin: 2.9 g/dL (calc) (ref 1.9–3.7)
Glucose, Bld: 193 mg/dL — ABNORMAL HIGH (ref 65–99)
Potassium: 4.6 mmol/L (ref 3.5–5.3)
Sodium: 139 mmol/L (ref 135–146)
Total Bilirubin: 0.6 mg/dL (ref 0.2–1.2)
Total Protein: 7.5 g/dL (ref 6.1–8.1)

## 2019-08-24 LAB — VITAMIN D 25 HYDROXY (VIT D DEFICIENCY, FRACTURES): Vit D, 25-Hydroxy: 31 ng/mL (ref 30–100)

## 2019-08-24 LAB — HEMOGLOBIN A1C
Hgb A1c MFr Bld: 9.9 % of total Hgb — ABNORMAL HIGH (ref <5.7)
Mean Plasma Glucose: 237 (calc)
eAG (mmol/L): 13.2 (calc)

## 2019-08-28 ENCOUNTER — Encounter: Payer: Self-pay | Admitting: Internal Medicine

## 2019-09-17 ENCOUNTER — Other Ambulatory Visit: Payer: Self-pay | Admitting: Internal Medicine

## 2019-09-18 NOTE — Telephone Encounter (Signed)
Last filled 04/24/2019... please advise

## 2019-11-01 DIAGNOSIS — E559 Vitamin D deficiency, unspecified: Secondary | ICD-10-CM | POA: Diagnosis not present

## 2019-11-01 DIAGNOSIS — E1165 Type 2 diabetes mellitus with hyperglycemia: Secondary | ICD-10-CM | POA: Diagnosis not present

## 2019-11-01 DIAGNOSIS — E1159 Type 2 diabetes mellitus with other circulatory complications: Secondary | ICD-10-CM | POA: Diagnosis not present

## 2019-11-01 DIAGNOSIS — E1169 Type 2 diabetes mellitus with other specified complication: Secondary | ICD-10-CM | POA: Diagnosis not present

## 2019-11-08 ENCOUNTER — Ambulatory Visit: Payer: BC Managed Care – PPO | Attending: Internal Medicine

## 2019-11-08 DIAGNOSIS — Z20822 Contact with and (suspected) exposure to covid-19: Secondary | ICD-10-CM | POA: Diagnosis not present

## 2019-11-19 DIAGNOSIS — H31103 Choroidal degeneration, unspecified, bilateral: Secondary | ICD-10-CM | POA: Diagnosis not present

## 2019-11-19 LAB — HM DIABETES EYE EXAM

## 2019-11-20 ENCOUNTER — Encounter: Payer: Self-pay | Admitting: Internal Medicine

## 2019-12-19 DIAGNOSIS — M545 Low back pain: Secondary | ICD-10-CM | POA: Diagnosis not present

## 2019-12-25 DIAGNOSIS — M545 Low back pain: Secondary | ICD-10-CM | POA: Diagnosis not present

## 2019-12-27 DIAGNOSIS — M545 Low back pain: Secondary | ICD-10-CM | POA: Diagnosis not present

## 2020-01-02 ENCOUNTER — Encounter: Payer: Self-pay | Admitting: Internal Medicine

## 2020-01-02 ENCOUNTER — Ambulatory Visit: Payer: BC Managed Care – PPO | Admitting: Internal Medicine

## 2020-01-02 ENCOUNTER — Ambulatory Visit (INDEPENDENT_AMBULATORY_CARE_PROVIDER_SITE_OTHER)
Admission: RE | Admit: 2020-01-02 | Discharge: 2020-01-02 | Disposition: A | Discharge status: Home / Self Care | Payer: BC Managed Care – PPO | Source: Ambulatory Visit | Attending: Internal Medicine | Admitting: Internal Medicine

## 2020-01-02 ENCOUNTER — Other Ambulatory Visit: Payer: Self-pay

## 2020-01-02 VITALS — BP 128/84 | HR 87 | Temp 97.6°F | Wt 229.0 lb

## 2020-01-02 DIAGNOSIS — M79672 Pain in left foot: Secondary | ICD-10-CM

## 2020-01-02 DIAGNOSIS — S92352A Displaced fracture of fifth metatarsal bone, left foot, initial encounter for closed fracture: Secondary | ICD-10-CM | POA: Diagnosis not present

## 2020-01-02 DIAGNOSIS — M545 Low back pain: Secondary | ICD-10-CM | POA: Diagnosis not present

## 2020-01-02 MED ORDER — KETOROLAC TROMETHAMINE 30 MG/ML IJ SOLN
30.0000 mg | Freq: Once | INTRAMUSCULAR | Status: AC
  Administered 2020-01-02: 17:00:00 30 mg via INTRAMUSCULAR

## 2020-01-02 NOTE — Patient Instructions (Signed)

## 2020-01-02 NOTE — Progress Notes (Signed)
Subjective:    Patient ID: Tasha Leonard, female    DOB: 1960/12/09, 59 y.o.   MRN: [id number]  HPI  Pt presents to the clinic today with c/o left foot pain. This started 3 weeks ago. She describes the pain as aching. It is sensitive and tender to touch. The pain is worse with ambulation. She denies numbness, tingling or weakness. She denies any injury that she is aware of. She has tried ice and Ibuprofen without any relief.  Review of Systems      Past Medical History:  Diagnosis Date  . Allergy   . Chicken pox   . Diabetes mellitus without complication ([place])   . Hyperlipidemia   . Hypertension   . Kidney stones     Current Outpatient Medications  Medication Sig Dispense Refill  . Blood Glucose Monitoring Suppl (ONE TOUCH ULTRA SYSTEM KIT) W/DEVICE KIT 1 kit by Does not apply route once. 1 each 0  . ezetimibe (ZETIA) 10 MG tablet TAKE 1 TABLET BY MOUTH ONCE DAILY 30 tablet 3  . fluticasone (FLONASE) 50 MCG/ACT nasal spray Place 2 sprays into both nostrils daily. 16 g 6  . glipiZIDE (GLUCOTROL) 10 MG tablet TAKE 1 TABLET BY MOUTH TWICE (2) DAILY BEFORE A MEAL 60 tablet 3  . glucose blood (ONE TOUCH TEST STRIPS) test strip 1 each by Other route 3 (three) times daily. 200 each 3  . ibuprofen (ADVIL,MOTRIN) 400 MG tablet Take 400 mg by mouth daily as needed.    Marland Kitchen lisinopril-hydrochlorothiazide (ZESTORETIC) 10-12.5 MG tablet TAKE 1 TABLET BY MOUTH ONCE A DAY 30 tablet 3  . Loratadine (CLARITIN) 10 MG CAPS Take 1 capsule by mouth daily.    . Melatonin 10 MG TABS Take by mouth.    . Omega-3 Fatty Acids (FISH OIL PO) Take 1 capsule by mouth daily.    . ONE TOUCH ULTRA TEST test strip USE TO TEST 3 TIMES DAILY AS DIRECTED 200 each 3  . ONETOUCH DELICA LANCETS 48N MISC USE 3 TIMES DAILY AS DIRECTED 200 each 3  . POTASSIUM PO Take by mouth.    . rosuvastatin (CRESTOR) 20 MG tablet     . Semaglutide, 1 MG/DOSE, (OZEMPIC, 1 MG/DOSE,) 2 MG/1.5ML SOPN Inject 0.75 mLs into the skin  every 7 (seven) days.    . traMADol (ULTRAM) 50 MG tablet TAKE 1 TABLET BY MOUTH EVERY 8 HOURS AS NEEDED 30 tablet 0   No current facility-administered medications for this visit.    Allergies  Allergen Reactions  . Metformin And Related Other (See Comments)    GI upset  . Morphine And Related Other (See Comments)    Migraine headaches    Family History  Problem Relation Age of Onset  . Arthritis Mother   . Cancer Mother   . Hyperlipidemia Mother   . Diabetes Mother   . Hyperlipidemia Father   . Arthritis Maternal Grandmother   . Diabetes Maternal Grandmother   . Arthritis Maternal Grandfather   . Hyperlipidemia Maternal Grandfather   . Heart disease Maternal Grandfather   . Stroke Maternal Grandfather   . Cancer Paternal Grandmother   . Diabetes Paternal Grandfather     Social History   Socioeconomic History  . Marital status: Single    Spouse name: Not on file  . Number of children: Not on file  . Years of education: Not on file  . Highest education level: Not on file  Occupational History  . Not on file  Tobacco  Use  . Smoking status: Former Smoker    Years: 2.00    Types: Cigarettes  . Smokeless tobacco: Never Used  Substance and Sexual Activity  . Alcohol use: Yes    Comment: occasional  . Drug use: No  . Sexual activity: Not on file  Other Topics Concern  . Not on file  Social History Narrative  . Not on file   Social Determinants of Health   Financial Resource Strain:   . Difficulty of Paying Living Expenses:   Food Insecurity:   . Worried About Charity fundraiser in the Last Year:   . Arboriculturist in the Last Year:   Transportation Needs:   . Film/video editor (Medical):   Marland Kitchen Lack of Transportation (Non-Medical):   Physical Activity:   . Days of Exercise per Week:   . Minutes of Exercise per Session:   Stress:   . Feeling of Stress :   Social Connections:   . Frequency of Communication with Friends and Family:   . Frequency of  Social Gatherings with Friends and Family:   . Attends Religious Services:   . Active Member of Clubs or Organizations:   . Attends Archivist Meetings:   Marland Kitchen Marital Status:   Intimate Partner Violence:   . Fear of Current or Ex-Partner:   . Emotionally Abused:   Marland Kitchen Physically Abused:   . Sexually Abused:      Constitutional: Denies fever, malaise, fatigue, headache or abrupt weight changes.  Respiratory: Denies difficulty breathing, shortness of breath, cough or sputum production.   Cardiovascular: Denies chest pain, chest tightness, palpitations or swelling in the hands or feet.  Musculoskeletal: Pt reports left foot pain. Denies decrease in range of motion, difficulty with gait, muscle pain or joint  swelling.  Skin: Denies redness, rashes, lesions or ulcercations.  Neurological: Denies numbness, tingling, weakness or problems with balance and coordination.    No other specific complaints in a complete review of systems (except as listed in HPI above).  Objective:   Physical Exam   BP 128/84   Pulse 87   Temp 97.6 F (36.4 C) (Temporal)   Wt 229 lb (103.9 kg)   SpO2 98%   BMI 38.70 kg/m   Wt Readings from Last 3 Encounters:  08/23/19 234 lb (106.1 kg)  10/18/18 232 lb (105.2 kg)  09/28/18 240 lb (108.9 kg)    General: Appears her stated age, obese in NAD. Skin: Warm, dry and intact. No redness or warmth noted. Cardiovascular: Normal rate and rhythm. S1,S2 noted.  No murmur, rubs or gallops noted. No JVD or BLE edema. Pedal pulses 2+ BLE. Pulmonary/Chest: Normal effort and positive vesicular breath sounds. No respiratory distress. No wheezes, rales or ronchi noted.  Musculoskeletal: Normal flexion, extension, rotation of the left ankle. Pain with palaption over the let lateral 5th metatarsal. Limps with normal gait. Neurological: Alert and oriented. Sensation intact to LLE.  BMET    Component Value Date/Time   NA 139 08/23/2019 1608   K 4.6 08/23/2019  1608   K 3.8 06/30/2014 1546   CL 100 08/23/2019 1608   CO2 28 08/23/2019 1608   GLUCOSE 193 (H) 08/23/2019 1608   BUN 13 08/23/2019 1608   CREATININE 0.78 08/23/2019 1608   CALCIUM 10.1 08/23/2019 1608   GFRNONAA >60 03/11/2016 1444   GFRAA >60 03/11/2016 1444    Lipid Panel     Component Value Date/Time   CHOL 114 08/23/2019 1608  TRIG 168 (H) 08/23/2019 1608   HDL 48 (L) 08/23/2019 1608   CHOLHDL 2.4 08/23/2019 1608   VLDL 29.4 01/24/2019 0856   LDLCALC 41 08/23/2019 1608    CBC    Component Value Date/Time   WBC 11.3 (H) 08/23/2019 1608   RBC 4.88 08/23/2019 1608   HGB 14.4 08/23/2019 1608   HCT 42.3 08/23/2019 1608   PLT 316 08/23/2019 1608   MCV 86.7 08/23/2019 1608   MCH 29.5 08/23/2019 1608   MCHC 34.0 08/23/2019 1608   RDW 13.1 08/23/2019 1608    Hgb A1C Lab Results  Component Value Date   HGBA1C 9.9 (H) 08/23/2019           Assessment & Plan:  Left Foot Pain:  Xray left foot today Toradol 30 mg IM today Encouraged elevation and ice  Will follow up after xray, return precautions discussed  Webb Silversmith, NP This visit occurred during the SARS-CoV-2 public health emergency.  Safety protocols were in place, including screening questions prior to the visit, additional usage of staff PPE, and extensive cleaning of exam room while observing appropriate contact time as indicated for disinfecting solutions.

## 2020-01-02 NOTE — Addendum Note (Signed)
Addended by: Lurlean Nanny on: 01/02/2020 04:49 PM   Modules accepted: Orders

## 2020-01-03 ENCOUNTER — Telehealth: Payer: Self-pay | Admitting: Family Medicine

## 2020-01-03 DIAGNOSIS — S92355A Nondisplaced fracture of fifth metatarsal bone, left foot, initial encounter for closed fracture: Secondary | ICD-10-CM | POA: Diagnosis not present

## 2020-01-03 NOTE — Telephone Encounter (Signed)
Called and spoke with the patient regarding her X-ray result. Discussed going to [facility] after 1 pm today for treatment given that our offices are closed. I will forward to her PCP as an FYI.

## 2020-01-06 ENCOUNTER — Encounter: Payer: Self-pay | Admitting: Internal Medicine

## 2020-01-06 ENCOUNTER — Telehealth: Payer: Self-pay

## 2020-01-06 NOTE — Telephone Encounter (Signed)
[place] Night - Client Nonclinical Telephone Record  [facility] Client [facility] Night - Client Client Site [place] Physician Webb Silversmith - NP Contact Type Call Who Is Calling Physician / Provider / Hospital Call Type Provider Call [facility] Page Now Reason for Call Request to speak to Physician Initial Comment Caller from [facility] Radiology needs to report on a left foot x-ray. Additional Comment Any representative can provide the details of the x-ray report (fracture.) Patient Name Tasha Leonard Patient DOB [birth year] Requesting Provider [facility] assistant Physician Number [phone] Facility Name [facility] Disp. Time Disposition Final User 01/03/2020 9:03:51 AM Send to [facility] Paging Queue Candy Sledge 01/03/2020 9:07:19 AM Called On-Call Provider Rulon Eisenmenger 01/03/2020 9:08:54 AM Page Completed Yes Samson Frederic DoctorName Phone DateTime Result/Outcome Message Type Notes Tommi Rumps - MD [id number] 01/03/2020 9:07:19 AM Called On Call Provider - Reached Doctor Paged Tommi Rumps - MD 01/03/2020 9:08:47 AM Spoke with On Call - General Message Result Call Closed By: Rulon Eisenmenger Transaction Date/Time: 01/03/2020 8:59:44 AM (ET)

## 2020-01-06 NOTE — Telephone Encounter (Signed)
This was taken care of at the [facility] on 01/02/20

## 2020-01-07 ENCOUNTER — Other Ambulatory Visit: Payer: Self-pay | Admitting: Internal Medicine

## 2020-01-07 NOTE — Telephone Encounter (Signed)
Last filled 09/18/2019.Marland KitchenMarland KitchenMarland Kitchen please advise

## 2020-01-14 DIAGNOSIS — M545 Low back pain: Secondary | ICD-10-CM | POA: Diagnosis not present

## 2020-01-16 DIAGNOSIS — M545 Low back pain: Secondary | ICD-10-CM | POA: Diagnosis not present

## 2020-01-30 ENCOUNTER — Encounter: Payer: Self-pay | Admitting: Internal Medicine

## 2020-02-03 ENCOUNTER — Other Ambulatory Visit: Payer: Self-pay | Admitting: Internal Medicine

## 2020-02-03 ENCOUNTER — Encounter: Payer: Self-pay | Admitting: Internal Medicine

## 2020-02-05 DIAGNOSIS — S92355A Nondisplaced fracture of fifth metatarsal bone, left foot, initial encounter for closed fracture: Secondary | ICD-10-CM | POA: Diagnosis not present

## 2020-02-06 ENCOUNTER — Other Ambulatory Visit: Payer: Self-pay | Admitting: Internal Medicine

## 2020-02-06 DIAGNOSIS — Z1231 Encounter for screening mammogram for malignant neoplasm of breast: Secondary | ICD-10-CM

## 2020-02-10 DIAGNOSIS — E1165 Type 2 diabetes mellitus with hyperglycemia: Secondary | ICD-10-CM | POA: Diagnosis not present

## 2020-02-11 ENCOUNTER — Ambulatory Visit (INDEPENDENT_AMBULATORY_CARE_PROVIDER_SITE_OTHER): Payer: BC Managed Care – PPO

## 2020-02-11 ENCOUNTER — Ambulatory Visit: Payer: BC Managed Care – PPO | Admitting: Podiatry

## 2020-02-11 ENCOUNTER — Encounter: Payer: Self-pay | Admitting: Podiatry

## 2020-02-11 ENCOUNTER — Other Ambulatory Visit: Payer: Self-pay

## 2020-02-11 DIAGNOSIS — M7672 Peroneal tendinitis, left leg: Secondary | ICD-10-CM | POA: Diagnosis not present

## 2020-02-11 MED ORDER — MELOXICAM 15 MG PO TABS: 15.0000 mg | ORAL_TABLET | Freq: Every day | ORAL | 1 refills | Status: DC

## 2020-02-11 MED ORDER — METHYLPREDNISOLONE 4 MG PO TBPK: ORAL_TABLET | ORAL | 0 refills | Status: AC

## 2020-02-13 NOTE — Progress Notes (Signed)
   HPI: 59 y.o. female presenting today as a new patient with a chief complaint of constant aching, throbbing pain of the left lateral foot that began 8 weeks ago. She states she fractured the fifth metatarsal 8 weeks ago and has had constant pain since. She is unsure how the fracture occurred and denies any known trauma or injury. She has been icing the foot, taking Tramadol and Aleve for treatment. She states she used a CAM boot for two weeks after the injury occurred. Patient is here for further evaluation and treatment.   Past Medical History:  Diagnosis Date  . Allergy   . Chicken pox   . Diabetes mellitus without complication ([place])   . Hyperlipidemia   . Hypertension   . Kidney stones      Physical Exam: General: The patient is alert and oriented x3 in no acute distress.  Dermatology: Skin is warm, dry and supple bilateral lower extremities. Negative for open lesions or macerations.  Vascular: Palpable pedal pulses bilaterally. No edema or erythema noted. Capillary refill within normal limits.  Neurological: Epicritic and protective threshold grossly intact bilaterally.   Musculoskeletal Exam: Pain with palpation to the insertion of the peroneal tendon of the left foot. Range of motion within normal limits to all pedal and ankle joints bilateral. Muscle strength 5/5 in all groups bilateral.   Radiographic Exam:  Normal osseous mineralization. Joint spaces preserved. No fracture/dislocation/boney destruction.    Assessment: 1. Insertional peroneal tendinitis left    Plan of Care:  1. Patient evaluated. X-Rays reviewed.  2. Injection of 0.5 mLs Celestone Soluspan injected into the insertion of the peroneal tendon of the left foot.  3. Prescription for Medrol Dose Pak provided to patient. 4. Prescription for Meloxicam provided to patient. 5. CAM boot dispensed.  6. Return to clinic in 4 weeks.       Edrick Kins, DPM [facility]  Dr. Edrick Kins, DPM     [address], [place] [address]                Office [phone]  Fax [phone]

## 2020-03-06 ENCOUNTER — Other Ambulatory Visit: Payer: Self-pay | Admitting: Internal Medicine

## 2020-03-10 ENCOUNTER — Ambulatory Visit: Payer: BC Managed Care – PPO | Admitting: Podiatry

## 2020-03-10 ENCOUNTER — Encounter: Payer: Self-pay | Admitting: Podiatry

## 2020-03-10 ENCOUNTER — Other Ambulatory Visit: Payer: Self-pay

## 2020-03-10 DIAGNOSIS — M7672 Peroneal tendinitis, left leg: Secondary | ICD-10-CM

## 2020-03-10 NOTE — Progress Notes (Signed)
   HPI: 59 y.o. female presenting today for follow-up evaluation of peroneal tendinitis insertional to the left foot.  Patient states she is much better.  She denies any pain.  She has been wearing the cam boot as directed.  She also took the prednisone pack and is currently taking the meloxicam.  She states she only has 1 pill left.  Otherwise no pain associated to the foot and no new complaints at this time.  She is also been transitioning out of the cam boot over the last week.  Past Medical History:  Diagnosis Date  . Allergy   . Chicken pox   . Diabetes mellitus without complication ([place])   . Hyperlipidemia   . Hypertension   . Kidney stones      Physical Exam: General: The patient is alert and oriented x3 in no acute distress.  Dermatology: Skin is warm, dry and supple bilateral lower extremities. Negative for open lesions or macerations.  Vascular: Palpable pedal pulses bilaterally. No edema or erythema noted. Capillary refill within normal limits.  Neurological: Epicritic and protective threshold grossly intact bilaterally.   Musculoskeletal Exam: Negative for pain with palpation to the insertion of the peroneal tendon of the left foot. Range of motion within normal limits to all pedal and ankle joints bilateral. Muscle strength 5/5 in all groups bilateral.   Assessment: 1. Insertional peroneal tendinitis left-resolved   Plan of Care:  1. Patient evaluated. 2.  Recommend transitioning out of the cam boot into good supportive tennis shoes over a 1-2-week. 3.  Continue meloxicam as needed.  Refill sent 4.  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       Edrick Kins, DPM [facility]  Dr. Edrick Kins, DPM    [address], [place] [address]                Office [phone]  Fax [phone]

## 2020-03-16 ENCOUNTER — Other Ambulatory Visit: Payer: Self-pay | Admitting: Podiatry

## 2020-03-24 ENCOUNTER — Other Ambulatory Visit: Payer: Self-pay

## 2020-03-24 ENCOUNTER — Ambulatory Visit
Admission: RE | Admit: 2020-03-24 | Discharge: 2020-03-24 | Disposition: A | Discharge status: Home / Self Care | Payer: BC Managed Care – PPO | Source: Ambulatory Visit | Attending: Internal Medicine | Admitting: Internal Medicine

## 2020-03-24 DIAGNOSIS — Z1231 Encounter for screening mammogram for malignant neoplasm of breast: Secondary | ICD-10-CM | POA: Diagnosis not present

## 2020-04-10 ENCOUNTER — Other Ambulatory Visit: Payer: Self-pay | Admitting: Internal Medicine

## 2020-04-10 NOTE — Telephone Encounter (Signed)
Last filled 01/07/2020...Marland Kitchen please advise

## 2020-07-08 ENCOUNTER — Other Ambulatory Visit: Payer: Self-pay | Admitting: Internal Medicine

## 2020-07-09 ENCOUNTER — Encounter: Payer: Self-pay | Admitting: Internal Medicine

## 2020-08-07 ENCOUNTER — Ambulatory Visit: Payer: BC Managed Care – PPO | Admitting: Internal Medicine

## 2020-08-12 ENCOUNTER — Other Ambulatory Visit: Payer: Self-pay | Admitting: Internal Medicine

## 2020-08-13 ENCOUNTER — Ambulatory Visit (INDEPENDENT_AMBULATORY_CARE_PROVIDER_SITE_OTHER): Payer: BC Managed Care – PPO

## 2020-08-13 DIAGNOSIS — Z23 Encounter for immunization: Secondary | ICD-10-CM | POA: Diagnosis not present

## 2020-08-13 NOTE — Telephone Encounter (Signed)
Last filled 04/13/2020...Marland Kitchen please advise

## 2020-08-14 DIAGNOSIS — E1159 Type 2 diabetes mellitus with other circulatory complications: Secondary | ICD-10-CM | POA: Diagnosis not present

## 2020-08-14 DIAGNOSIS — E1165 Type 2 diabetes mellitus with hyperglycemia: Secondary | ICD-10-CM | POA: Diagnosis not present

## 2020-08-14 DIAGNOSIS — E559 Vitamin D deficiency, unspecified: Secondary | ICD-10-CM | POA: Diagnosis not present

## 2020-08-14 DIAGNOSIS — E1169 Type 2 diabetes mellitus with other specified complication: Secondary | ICD-10-CM | POA: Diagnosis not present

## 2020-09-18 ENCOUNTER — Ambulatory Visit: Payer: BC Managed Care – PPO | Admitting: Internal Medicine

## 2020-11-24 ENCOUNTER — Other Ambulatory Visit: Payer: Self-pay | Admitting: Internal Medicine

## 2020-11-26 ENCOUNTER — Other Ambulatory Visit: Payer: Self-pay | Admitting: Internal Medicine

## 2020-11-26 DIAGNOSIS — E119 Type 2 diabetes mellitus without complications: Secondary | ICD-10-CM | POA: Diagnosis not present

## 2020-11-26 MED ORDER — EZETIMIBE 10 MG PO TABS: 10.0000 mg | ORAL_TABLET | Freq: Every day | ORAL | 0 refills | Status: DC

## 2020-11-26 MED ORDER — LISINOPRIL-HYDROCHLOROTHIAZIDE 10-12.5 MG PO TABS: 1.0000 | ORAL_TABLET | Freq: Every day | ORAL | 0 refills | Status: DC

## 2020-11-26 NOTE — Addendum Note (Signed)
Addended by: Lurlean Nanny on: 11/26/2020 04:27 PM   Modules accepted: Orders

## 2020-11-26 NOTE — Addendum Note (Signed)
Addended by: Lurlean Nanny on: 11/26/2020 04:26 PM   Modules accepted: Orders

## 2021-01-20 ENCOUNTER — Other Ambulatory Visit: Payer: Self-pay | Admitting: Internal Medicine

## 2021-01-20 NOTE — Telephone Encounter (Signed)
Last filled 08-13-20 #30 Last OV 01-02-20 Next OV 02-26-21 with Dr [facility]

## 2021-02-01 ENCOUNTER — Encounter: Payer: BC Managed Care – PPO | Admitting: Internal Medicine

## 2021-02-03 ENCOUNTER — Other Ambulatory Visit: Payer: Self-pay | Admitting: Family Medicine

## 2021-02-03 DIAGNOSIS — E559 Vitamin D deficiency, unspecified: Secondary | ICD-10-CM

## 2021-02-03 DIAGNOSIS — E1165 Type 2 diabetes mellitus with hyperglycemia: Secondary | ICD-10-CM

## 2021-02-15 ENCOUNTER — Other Ambulatory Visit: Payer: Self-pay | Admitting: Internal Medicine

## 2021-02-19 ENCOUNTER — Other Ambulatory Visit (INDEPENDENT_AMBULATORY_CARE_PROVIDER_SITE_OTHER): Payer: BC Managed Care – PPO

## 2021-02-19 ENCOUNTER — Other Ambulatory Visit: Payer: Self-pay

## 2021-02-19 DIAGNOSIS — E559 Vitamin D deficiency, unspecified: Secondary | ICD-10-CM

## 2021-02-19 DIAGNOSIS — E1165 Type 2 diabetes mellitus with hyperglycemia: Secondary | ICD-10-CM

## 2021-02-19 LAB — CBC WITH DIFFERENTIAL/PLATELET
Basophils Absolute: 0.1 10*3/uL (ref 0.0–0.1)
Basophils Relative: 0.8 % (ref 0.0–3.0)
Eosinophils Absolute: 0.3 10*3/uL (ref 0.0–0.7)
Eosinophils Relative: 3.8 % (ref 0.0–5.0)
HCT: 42.5 % (ref 36.0–46.0)
Hemoglobin: 14.3 g/dL (ref 12.0–15.0)
Lymphocytes Relative: 19 % (ref 12.0–46.0)
Lymphs Abs: 1.7 10*3/uL (ref 0.7–4.0)
MCHC: 33.7 g/dL (ref 30.0–36.0)
MCV: 88.4 fl (ref 78.0–100.0)
Monocytes Absolute: 0.7 10*3/uL (ref 0.1–1.0)
Monocytes Relative: 7.5 % (ref 3.0–12.0)
Neutro Abs: 6.2 10*3/uL (ref 1.4–7.7)
Neutrophils Relative %: 68.9 % (ref 43.0–77.0)
Platelets: 250 10*3/uL (ref 150.0–400.0)
RBC: 4.81 Mil/uL (ref 3.87–5.11)
RDW: 13.6 % (ref 11.5–15.5)
WBC: 8.9 10*3/uL (ref 4.0–10.5)

## 2021-02-19 LAB — HEMOGLOBIN A1C: Hgb A1c MFr Bld: 8.2 % — ABNORMAL HIGH (ref 4.6–6.5)

## 2021-02-19 LAB — COMPREHENSIVE METABOLIC PANEL
ALT: 8 U/L (ref 0–35)
AST: 11 U/L (ref 0–37)
Albumin: 4.2 g/dL (ref 3.5–5.2)
Alkaline Phosphatase: 85 U/L (ref 39–117)
BUN: 14 mg/dL (ref 6–23)
CO2: 27 mEq/L (ref 19–32)
Calcium: 9.2 mg/dL (ref 8.4–10.5)
Chloride: 105 mEq/L (ref 96–112)
Creatinine, Ser: 0.81 mg/dL (ref 0.40–1.20)
GFR: 79.82 mL/min (ref >60.00)
Glucose, Bld: 197 mg/dL — ABNORMAL HIGH (ref 70–99)
Potassium: 3.7 mEq/L (ref 3.5–5.1)
Sodium: 141 mEq/L (ref 135–145)
Total Bilirubin: 0.6 mg/dL (ref 0.2–1.2)
Total Protein: 7.1 g/dL (ref 6.0–8.3)

## 2021-02-19 LAB — LIPID PANEL
Cholesterol: 148 mg/dL (ref 0–200)
HDL: 43.7 mg/dL (ref >39.00)
LDL Cholesterol: 67 mg/dL (ref 0–99)
NonHDL: 104.35
Total CHOL/HDL Ratio: 3
Triglycerides: 188 mg/dL — ABNORMAL HIGH (ref 0.0–149.0)
VLDL: 37.6 mg/dL (ref 0.0–40.0)

## 2021-02-19 LAB — VITAMIN D 25 HYDROXY (VIT D DEFICIENCY, FRACTURES): VITD: 16.28 ng/mL — ABNORMAL LOW (ref 30.00–100.00)

## 2021-02-26 ENCOUNTER — Encounter: Payer: BC Managed Care – PPO | Admitting: Family Medicine

## 2021-02-26 NOTE — Telephone Encounter (Signed)
Pharmacy requests refill on: ezetimibe   LAST REFILL: 11/26/20 #30, 0 refill LAST OV:  01/02/20 [place] acute Lfoot pain/ 08/2019 last annual PATIENT PAST DUE   NEXT OV: No upcoming appts, NEEDS APPT!!!!  PHARMACY: [facility]

## 2021-03-09 ENCOUNTER — Telehealth: Payer: Self-pay | Admitting: Family Medicine

## 2021-03-09 NOTE — Telephone Encounter (Signed)
Noted. Thanks. I'll defer.  

## 2021-03-09 NOTE — Telephone Encounter (Signed)
-----   Message from Filbert Berthold, [place] sent at 03/09/2021  2:57 PM EDT ----- Patient will stay with our office and establish care with one of the new Nps when able. I updated chart. Also does not need social work help. She just does not have insurance right now but is working on getting some.  ----- Message ----- From: Tonia Ghent, MD Sent: 03/07/2021   7:41 PM EDT To: Filbert Berthold, CMA  Please see if patient is going to be able to follow-up at the clinic here.  If I need to put in a social work consult regarding finances then let me know.  Thanks.

## 2021-06-24 ENCOUNTER — Encounter: Payer: BC Managed Care – PPO | Admitting: Nurse Practitioner

## 2021-07-13 ENCOUNTER — Other Ambulatory Visit: Payer: Self-pay

## 2021-07-13 ENCOUNTER — Ambulatory Visit (INDEPENDENT_AMBULATORY_CARE_PROVIDER_SITE_OTHER): Payer: BC Managed Care – PPO | Admitting: Family Medicine

## 2021-07-13 VITALS — BP 138/92 | HR 85 | Temp 96.0°F | Ht 64.1 in | Wt 228.2 lb

## 2021-07-13 DIAGNOSIS — N898 Other specified noninflammatory disorders of vagina: Secondary | ICD-10-CM

## 2021-07-13 MED ORDER — SULFAMETHOXAZOLE-TRIMETHOPRIM 800-160 MG PO TABS: 1.0000 | ORAL_TABLET | Freq: Two times a day (BID) | ORAL | 0 refills | Status: AC

## 2021-07-13 NOTE — Assessment & Plan Note (Signed)
Etiology unclear. Abscess vs hematoma vs bartholin gland cyst? As not fluctuant today on exam lower concern for abscess but given some bloody discharge and thin layer of skin will start abx in case of infection and pt schedule with GYN tomorrow for further evaluation and treatment. Appreciate GYN support.

## 2021-07-13 NOTE — Progress Notes (Signed)
Subjective:     Tasha Leonard is a 60 y.o. female presenting for Cyst (X 5 days. On inside of vagina. Has been bleeding since yesterday. )     HPI  #Vaginal cyst - started 5 days ago - on the labia of the vagina - has noticed some bleeding but this got better - did have some cysts/lesions when she had covid and this got better with neosporin - no bleeding with those - has noticed a lot of blood - has had to change pads - no odor or discharge - cyst is painful- with any movement - has not tried warm compress - does not have gyn   Review of Systems  Constitutional:  Negative for chills and fever.  Genitourinary:  Positive for vaginal pain. Negative for dysuria, urgency, vaginal bleeding and vaginal discharge.    Social History   Tobacco Use  Smoking Status Former   Years: 2.00   Types: Cigarettes  Smokeless Tobacco Never        Objective:    BP Readings from Last 3 Encounters:  07/13/21 (!) 138/92  01/02/20 128/84  08/23/19 122/84   Wt Readings from Last 3 Encounters:  07/13/21 228 lb 4 oz (103.5 kg)  01/02/20 229 lb (103.9 kg)  08/23/19 234 lb (106.1 kg)    BP (!) 138/92   Pulse 85   Temp (!) 96 F (35.6 C) (Temporal)   Ht 5' 4.1" (1.628 m)   Wt 228 lb 4 oz (103.5 kg)   SpO2 96%   BMI 39.06 kg/m    Physical Exam Exam conducted with a chaperone present.  Constitutional:      General: She is not in acute distress.    Appearance: She is well-developed. She is not diaphoretic.  HENT:     Right Ear: External ear normal.     Left Ear: External ear normal.  Eyes:     Conjunctiva/sclera: Conjunctivae normal.  Cardiovascular:     Rate and Rhythm: Normal rate.  Pulmonary:     Effort: Pulmonary effort is normal.  Genitourinary:    Comments: 2x1 cm firm lesion on the labia minora near the vaginal opening which is firm with no active bleeding but blood is noted at the vaginal opening. A thin layer of yellow skin over the top of the  lesion Musculoskeletal:     Cervical back: Neck supple.  Skin:    General: Skin is warm and dry.     Capillary Refill: Capillary refill takes less than 2 seconds.  Neurological:     Mental Status: She is alert. Mental status is at baseline.  Psychiatric:        Mood and Affect: Mood normal.        Behavior: Behavior normal.          Assessment & Plan:   Problem List Items Addressed This Visit       Other   Vaginal lesion - Primary    Etiology unclear. Abscess vs hematoma vs bartholin gland cyst? As not fluctuant today on exam lower concern for abscess but given some bloody discharge and thin layer of skin will start abx in case of infection and pt schedule with GYN tomorrow for further evaluation and treatment. Appreciate GYN support.       Relevant Medications   sulfamethoxazole-trimethoprim (BACTRIM DS) 800-160 MG tablet   Other Relevant Orders   Ambulatory referral to Obstetrics / Gynecology     Return if symptoms worsen or fail to  improve.  Lesleigh Noe, MD  This visit occurred during the SARS-CoV-2 public health emergency.  Safety protocols were in place, including screening questions prior to the visit, additional usage of staff PPE, and extensive cleaning of exam room while observing appropriate contact time as indicated for disinfecting solutions.

## 2021-07-13 NOTE — Patient Instructions (Signed)
Start antibiotic Try warm compress to the lesion OB/GYN referral Should get phone call within 1-2 days

## 2021-07-14 ENCOUNTER — Encounter: Payer: Self-pay | Admitting: Obstetrics and Gynecology

## 2021-07-14 ENCOUNTER — Other Ambulatory Visit: Payer: Self-pay

## 2021-07-14 ENCOUNTER — Ambulatory Visit (INDEPENDENT_AMBULATORY_CARE_PROVIDER_SITE_OTHER): Payer: BC Managed Care – PPO | Admitting: Obstetrics and Gynecology

## 2021-07-14 VITALS — BP 132/90 | HR 90 | Resp 16 | Ht 64.0 in | Wt 231.4 lb

## 2021-07-14 DIAGNOSIS — N764 Abscess of vulva: Secondary | ICD-10-CM | POA: Diagnosis not present

## 2021-07-14 NOTE — Progress Notes (Signed)
GYNECOLOGY PROGRESS NOTE  Subjective:    Patient ID: Tasha Leonard, female    DOB: [birth year], 60 y.o.   MRN: [id number]  HPI  Patient is a 60 y.o. G0P0 female was referred from her PCP for complaints of a vaginal lesion that has been present for ~  1 week.  Initially was very painful, however pain now down to 3/10.  Bleeding was noted from the area. Denies vaginal discharge, itching. She was started on an antibiotic yesterday Bactrim). The patient is currently sexually active. Denies postmenopausal bleeding.   The following portions of the patient's history were reviewed and updated as appropriate:   She  has a past medical history of Allergy, Chicken pox, Diabetes mellitus without complication ([place]), Hyperlipidemia, Hypertension, and Kidney stones.  She  has a past surgical history that includes Wrist surgery (Left, 1986); Retinal detachment surgery (Right); Cataract extraction, bilateral (04/29/2014,07/08/2014); Colonoscopy (N/A, 03/30/2015); and Refractive surgery (12/07/2018).  Her family history includes Arthritis in her maternal grandfather, maternal grandmother, and mother; Cancer in her mother and paternal grandmother; Diabetes in her maternal grandmother, mother, and paternal grandfather; Heart disease in her maternal grandfather; Hyperlipidemia in her father, maternal grandfather, and mother; Stroke in her maternal grandfather.  She  reports that she has quit smoking. Her smoking use included cigarettes. She has never used smokeless tobacco. She reports current alcohol use. She reports that she does not use drugs.  Current Outpatient Medications on File Prior to Visit  Medication Sig Dispense Refill   Blood Glucose Monitoring Suppl (ONE TOUCH ULTRA SYSTEM KIT) W/DEVICE KIT 1 kit by Does not apply route once. 1 each 0   ezetimibe (ZETIA) 10 MG tablet TAKE 1 TABLET BY MOUTH ONCE A DAY 30 tablet 0   fluticasone (FLONASE) 50 MCG/ACT nasal spray Place 2 sprays into both nostrils  daily. 16 g 6   glipiZIDE (GLUCOTROL) 10 MG tablet TAKE 1 TABLET BY MOUTH TWICE (2) DAILY BEFORE A MEAL 60 tablet 3   glucose blood (ONE TOUCH TEST STRIPS) test strip 1 each by Other route 3 (three) times daily. 200 each 3   ibuprofen (ADVIL,MOTRIN) 400 MG tablet Take 400 mg by mouth daily as needed.     lisinopril-hydrochlorothiazide (ZESTORETIC) 10-12.5 MG tablet TAKE 1 TABLET BY MOUTH ONCE A DAY (NEED EXAM FOR REFILLS) 30 tablet 1   Loratadine 10 MG CAPS Take 1 capsule by mouth daily.     Melatonin 10 MG TABS Take by mouth.     meloxicam (MOBIC) 15 MG tablet TAKE 1 TABLET BY MOUTH ONCE A DAY 30 tablet 3   methylPREDNISolone (MEDROL DOSEPAK) 4 MG TBPK tablet 6 day dose pack - take as directed 21 tablet 0   Omega-3 Fatty Acids (FISH OIL PO) Take 1 capsule by mouth daily.     ONE TOUCH ULTRA TEST test strip USE TO TEST 3 TIMES DAILY AS DIRECTED 200 each 3   ONETOUCH DELICA LANCETS 20U MISC USE 3 TIMES DAILY AS DIRECTED 200 each 3   POTASSIUM PO Take by mouth.     rosuvastatin (CRESTOR) 20 MG tablet      Semaglutide, 1 MG/DOSE, 2 MG/1.5ML SOPN Inject 0.75 mLs into the skin every 7 (seven) days.     sulfamethoxazole-trimethoprim (BACTRIM DS) 800-160 MG tablet Take 1 tablet by mouth 2 (two) times daily for 7 days. 14 tablet 0   traMADol (ULTRAM) 50 MG tablet TAKE 1 TABLET BY MOUTH EVERY 8 HOURS AS NEEDED 30 tablet 0  No current facility-administered medications on file prior to visit.   She is allergic to metformin and related and morphine and related..  Review of Systems Pertinent items noted in HPI and remainder of comprehensive ROS otherwise negative.   Objective:   Blood pressure (!) 169/93, pulse 87, resp. rate 16, height _0  (1.626 m), weight 231 lb 6.4 oz (105 kg).  Body mass index is 39.72 kg/m.  General appearance: alert and no distress Abdomen: soft, non-tender; bowel sounds normal; no masses,  no organomegaly Pelvic: external genitalia with ~ 1 x 1 cm shallow ulcerated  lesion on left labia majora near introitus with granulation tissue present. Rectovaginal septum normal. No bleeding present. Internal exam not performed.  Extremities: extremities normal, atraumatic, no cyanosis or edema Neurologic: Grossly normal   Assessment:    Vulvar abscess  Plan:   Vulvar abscess that appears to have ruptured, in state of healing.  Advised to continue antibiotics to completion, utilize sitz baths, keeping area clean and dry. To f/u in 1.5 - 2 weeks to ensure healing and no other infectious cause onoing.    Rubie Maid, MD [facility]

## 2021-07-15 NOTE — Patient Instructions (Signed)
Skin Abscess A skin abscess is an infected area of your skin that contains pus and other material. An abscess can happen in any part of your body. Some abscesses break open (rupture) on their own. Most continue to get worse unless they are treated. The infection can spread deeper into the body and into your blood, which can make you feel sick. A skin abscess is caused by germs that enter the skin through a cut or scrape. It can also be caused by blocked oil and sweat glands or infected hair follicles. This condition is usually treated by: Draining the pus. Taking antibiotic medicines. Placing a warm, wet washcloth over the abscess. Follow these instructions at home: Medicines  Take over-the-counter and prescription medicines only as told by your doctor. If you were prescribed an antibiotic medicine, take it as told by your doctor. Do not stop taking the antibiotic even if you start to feel better. Abscess care  If you have an abscess that has not drained, place a warm, clean, wet washcloth over the abscess several times a day. Do this as told by your doctor. Follow instructions from your doctor about how to take care of your abscess. Make sure you: Cover the abscess with a bandage (dressing). Change your bandage or gauze as told by your doctor. Wash your hands with soap and water before you change the bandage or gauze. If you cannot use soap and water, use hand sanitizer. Check your abscess every day for signs that the infection is getting worse. Check for: More redness, swelling, or pain. More fluid or blood. Warmth. More pus or a bad smell. General instructions To avoid spreading the infection: Do not share personal care items, towels, or hot tubs with others. Avoid making skin-to-skin contact with other people. Keep all follow-up visits as told by your doctor. This is important. Contact a doctor if: You have more redness, swelling, or pain around your abscess. You have more fluid or  blood coming from your abscess. Your abscess feels warm when you touch it. You have more pus or a bad smell coming from your abscess. You have a fever. Your muscles ache. You have chills. You feel sick. Get help right away if: You have very bad (severe) pain. You see red streaks on your skin spreading away from the abscess. Summary A skin abscess is an infected area of your skin that contains pus and other material. The abscess is caused by germs that enter the skin through a cut or scrape. It can also be caused by blocked oil and sweat glands or infected hair follicles. Follow your doctor's instructions on caring for your abscess, taking medicines, preventing infections, and keeping follow-up visits. This information is not intended to replace advice given to you by your health care provider. Make sure you discuss any questions you have with your health care provider. Document Revised: 04/25/2019 Document Reviewed: 11/02/2017 Elsevier Patient Education  2022 [employer].

## 2021-07-29 NOTE — Progress Notes (Signed)
    GYNECOLOGY PROGRESS NOTE  Subjective:    Patient ID: Tasha Leonard, female    DOB: Oct 31, 1960, 60 y.o.   MRN: [id number]  HPI  Patient is a 60 y.o.  G0P0000 female who presents for 2 week follow up of left vaginal/vulvar abscess. She has completed her antibiotic course and overall feels well. Denies any further issues with pain or drainage. She has mild shooting pain every now and again.  The following portions of the patient's history were reviewed and updated as appropriate: allergies, current medications, past family history, past medical history, past social history, past surgical history, and problem list.  Review of Systems Pertinent items noted in HPI and remainder of comprehensive ROS otherwise negative.   Objective:  Blood pressure (!) 170/95, pulse 83, resp. rate 16, height 5\' 4"  (1.626 m), weight 233 lb 4.8 oz (105.8 kg).  Body mass index is 40.05 kg/m.  General appearance: alert, cooperative, and no distress Pelvic: external genitalia normal and rectovaginal septum normal. Vaginal area well healed.   Assessment:   Vulvar abscess resolved Hypertension  Plan:   - No further intervention needed at this time. Can follow up as needed.  - Hypertension uncontrolled. Patient reports recently consuming a caffeineated beverage.    Rubie Maid, MD [facility]

## 2021-07-30 ENCOUNTER — Ambulatory Visit (INDEPENDENT_AMBULATORY_CARE_PROVIDER_SITE_OTHER): Payer: BC Managed Care – PPO | Admitting: Obstetrics and Gynecology

## 2021-07-30 ENCOUNTER — Other Ambulatory Visit: Payer: Self-pay

## 2021-07-30 ENCOUNTER — Encounter: Payer: Self-pay | Admitting: Obstetrics and Gynecology

## 2021-07-30 VITALS — BP 170/95 | HR 83 | Resp 16 | Ht 64.0 in | Wt 233.3 lb

## 2021-07-30 DIAGNOSIS — I10 Essential (primary) hypertension: Secondary | ICD-10-CM

## 2021-07-30 DIAGNOSIS — N764 Abscess of vulva: Secondary | ICD-10-CM | POA: Diagnosis not present

## 2021-11-12 DIAGNOSIS — Z961 Presence of intraocular lens: Secondary | ICD-10-CM | POA: Diagnosis not present

## 2021-11-12 DIAGNOSIS — H33002 Unspecified retinal detachment with retinal break, left eye: Secondary | ICD-10-CM | POA: Diagnosis not present

## 2021-11-15 DIAGNOSIS — H33002 Unspecified retinal detachment with retinal break, left eye: Secondary | ICD-10-CM | POA: Diagnosis not present

## 2022-01-10 DIAGNOSIS — J019 Acute sinusitis, unspecified: Secondary | ICD-10-CM | POA: Diagnosis not present

## 2022-02-08 ENCOUNTER — Ambulatory Visit: Payer: BC Managed Care – PPO | Admitting: Family Medicine

## 2022-03-07 DIAGNOSIS — E119 Type 2 diabetes mellitus without complications: Secondary | ICD-10-CM | POA: Diagnosis not present

## 2022-03-07 LAB — HM DIABETES EYE EXAM

## 2022-06-20 ENCOUNTER — Telehealth: Payer: Self-pay | Admitting: Internal Medicine

## 2023-03-02 NOTE — Telephone Encounter (Signed)
Error
